# Patient Record
Sex: Female | Born: 1970 | Race: Black or African American | Hispanic: No | Marital: Married | Smoking: Never smoker
Health system: Southern US, Community
[De-identification: ages and names within clinical notes are randomized; demographics above are authoritative.]

## PROBLEM LIST (undated history)

## (undated) DIAGNOSIS — M5481 Occipital neuralgia: Secondary | ICD-10-CM

## (undated) HISTORY — DX: Occipital neuralgia: M54.81

## (undated) HISTORY — PX: WISDOM TOOTH EXTRACTION: SHX21

---

## 1997-06-08 ENCOUNTER — Inpatient Hospital Stay (HOSPITAL_COMMUNITY): Admission: AD | Admit: 1997-06-08 | Discharge: 1997-06-08 | Payer: Self-pay | Admitting: Obstetrics

## 1997-06-10 ENCOUNTER — Inpatient Hospital Stay (HOSPITAL_COMMUNITY): Admission: RE | Admit: 1997-06-10 | Discharge: 1997-06-10 | Payer: Self-pay | Admitting: Obstetrics

## 1998-07-01 ENCOUNTER — Other Ambulatory Visit: Admission: RE | Admit: 1998-07-01 | Discharge: 1998-07-01 | Payer: Self-pay | Admitting: Obstetrics and Gynecology

## 1998-11-22 ENCOUNTER — Other Ambulatory Visit: Admission: RE | Admit: 1998-11-22 | Discharge: 1998-11-22 | Payer: Self-pay | Admitting: Obstetrics and Gynecology

## 1999-04-22 ENCOUNTER — Encounter: Admission: RE | Admit: 1999-04-22 | Discharge: 1999-07-21 | Payer: Self-pay | Admitting: Obstetrics & Gynecology

## 1999-05-23 ENCOUNTER — Inpatient Hospital Stay (HOSPITAL_COMMUNITY): Admission: AD | Admit: 1999-05-23 | Discharge: 1999-05-26 | Payer: Self-pay | Admitting: Obstetrics and Gynecology

## 2001-12-04 ENCOUNTER — Other Ambulatory Visit: Admission: RE | Admit: 2001-12-04 | Discharge: 2001-12-04 | Payer: Self-pay | Admitting: Obstetrics and Gynecology

## 2002-01-31 ENCOUNTER — Ambulatory Visit (HOSPITAL_COMMUNITY): Admission: RE | Admit: 2002-01-31 | Discharge: 2002-01-31 | Payer: Self-pay | Admitting: Obstetrics and Gynecology

## 2002-01-31 ENCOUNTER — Encounter: Payer: Self-pay | Admitting: Obstetrics and Gynecology

## 2002-05-29 ENCOUNTER — Inpatient Hospital Stay (HOSPITAL_COMMUNITY): Admission: AD | Admit: 2002-05-29 | Discharge: 2002-05-29 | Payer: Self-pay | Admitting: Obstetrics and Gynecology

## 2002-06-09 ENCOUNTER — Inpatient Hospital Stay (HOSPITAL_COMMUNITY): Admission: AD | Admit: 2002-06-09 | Discharge: 2002-06-09 | Payer: Self-pay | Admitting: Obstetrics and Gynecology

## 2002-06-21 ENCOUNTER — Inpatient Hospital Stay (HOSPITAL_COMMUNITY): Admission: AD | Admit: 2002-06-21 | Discharge: 2002-06-21 | Payer: Self-pay | Admitting: Obstetrics and Gynecology

## 2002-06-25 ENCOUNTER — Inpatient Hospital Stay (HOSPITAL_COMMUNITY): Admission: AD | Admit: 2002-06-25 | Discharge: 2002-06-25 | Payer: Self-pay | Admitting: Obstetrics and Gynecology

## 2002-06-25 ENCOUNTER — Encounter: Payer: Self-pay | Admitting: Obstetrics and Gynecology

## 2002-06-30 ENCOUNTER — Inpatient Hospital Stay (HOSPITAL_COMMUNITY): Admission: AD | Admit: 2002-06-30 | Discharge: 2002-07-03 | Payer: Self-pay | Admitting: Obstetrics and Gynecology

## 2003-07-08 ENCOUNTER — Other Ambulatory Visit: Admission: RE | Admit: 2003-07-08 | Discharge: 2003-07-08 | Payer: Self-pay | Admitting: Family Medicine

## 2004-10-12 ENCOUNTER — Other Ambulatory Visit: Admission: RE | Admit: 2004-10-12 | Discharge: 2004-10-12 | Payer: Self-pay | Admitting: Obstetrics and Gynecology

## 2004-10-30 ENCOUNTER — Inpatient Hospital Stay (HOSPITAL_COMMUNITY): Admission: AD | Admit: 2004-10-30 | Discharge: 2004-10-30 | Payer: Self-pay | Admitting: Obstetrics and Gynecology

## 2005-04-05 ENCOUNTER — Inpatient Hospital Stay (HOSPITAL_COMMUNITY): Admission: AD | Admit: 2005-04-05 | Discharge: 2005-04-05 | Payer: Self-pay | Admitting: Obstetrics and Gynecology

## 2005-04-10 ENCOUNTER — Inpatient Hospital Stay (HOSPITAL_COMMUNITY): Admission: AD | Admit: 2005-04-10 | Discharge: 2005-04-13 | Payer: Self-pay | Admitting: Obstetrics and Gynecology

## 2005-04-14 ENCOUNTER — Encounter: Admission: RE | Admit: 2005-04-14 | Discharge: 2005-04-29 | Payer: Self-pay | Admitting: Obstetrics and Gynecology

## 2006-02-24 ENCOUNTER — Emergency Department (HOSPITAL_COMMUNITY): Admission: EM | Admit: 2006-02-24 | Discharge: 2006-02-24 | Payer: Self-pay | Admitting: *Deleted

## 2006-11-26 ENCOUNTER — Encounter: Admission: RE | Admit: 2006-11-26 | Discharge: 2006-11-26 | Payer: Self-pay | Admitting: Obstetrics and Gynecology

## 2008-12-22 ENCOUNTER — Other Ambulatory Visit: Admission: RE | Admit: 2008-12-22 | Discharge: 2008-12-22 | Payer: Self-pay | Admitting: Family Medicine

## 2010-06-17 NOTE — Discharge Summary (Signed)
[facility]  Patient:    Danielle Bauer, Danielle Bauer                        MRN: [id number] Adm. Date:  [id number] Disc. Date: [id number] Attending:  Leonard Schwartz Dictator:   Wynelle Bourgeois, C.N.M.                           Discharge Summary  ADMISSION DIAGNOSES:          1. Intrauterine pregnancy at 38 weeks.                               2. Preeclampsia.                               3. Favorable cervix.  DISCHARGE DIAGNOSES:          1. Intrauterine pregnancy at 38 weeks.                               2. Preeclampsia.                               3. Favorable cervix.                               4. Status post spontaneous vaginal delivery, term                                  delivered.                               5. Third degree perineal laceration.                               6. Arrested second stage of labor.  PROCEDURE:                    1) Magnesium sulfate administration.  2) Vacuum extraction vaginal delivery.  3) Repair of third degree midline laceration. 4) Epidural anesthesia.  HOSPITAL COURSE:              Danielle Bauer is a 40 year old, gravida 3, para 0-0-2-0, at 79 weeks who presented for admission secondary to preeclampsia.  She had been followed for increased blood pressure and developed 2+ proteinuria on cath UA and was scheduled for induction of labor.  Pregnancy had been remarkable for preeclampsia, history of one TAB and one SAB.  Family history of congenital anomaly.  The patient was admitted and begun on a magnesium sulfate infusion per protocol.  PIH labs were drawn and the physicians followed her course while in he hospital.  Later that evening on May 23, 1999, her contractions began to get stronger on Pitocin administration and her cervix was found to be 3 cm dilated ith -3 station.  Amniotomy was performed for clear fluid.  The fetal heart rate tracing was reactive with no decellerations noted and Pitocin  infusion was continued. t 10:35 p.m. her cervix  was 4 cm dilated, 80% effaced, and -1 station with a vertex presentation.  Intrauterine pressure catheter and fetal scalp electrode were placed.  The patient requested an epidural and received that for pain relief. t approximately 4 oclock a.m. on May 24, 1999, the patients cervix was completely dilated with a +1 station and pushing was begun for the second stage.  At approximately 6:30 a.m. the assessment of arrested second stage of labor was noted by Janine Limbo, M.D. and a vacuum extractor was applied.  The findings included a nuchal cord and a third degree laceration.  The viable female infant named Danielle Bauer was delivered, Apgars 8 and 9.  Weight not recorded.  The infant was taken to the nursery.  The patient was taken to the AICU for magnesium sulfate administration.  Her course postpartum was essentially normal with one episode f hypotension noted with a blood pressure of 60/40 in the sitting position which ade the patient dizzy, so magnesium sulfate administration was ceased for six hours to allow the patient to compensate with her blood pressure.  Her lochia was moderate, her hemoglobin was 9.8.  IV fluids were administered and the patient felt better soon thereafter.  On May 25, 1999, which was her postpartum day #1, her blood  pressures were ranging 120 to 160 over 65 to 88.  Oxygen saturations were 99% on room air.  She was afebrile.  Intake was 1480 in and 1350 out per shift and lungs were clear.  Heart was regular and abdomen was soft with a firm fundus and small lochia.  Extremities revealed 2+ edema with reflexes at 2+.  SGOT and SGPT were  normal with a magnesium level of 4.5 and the patient continued to be monitored n the AICU.  On May 26, 1999, which was postpartum day #2, the patient was seen by Maris Berger. Haygood, M.D. with blood pressures ranging 130 to 154 over 82 to 92 after magnesium sulfate  was discontinued.  Her physical examination was within normal limits and she did not exhibit any further signs of headache, blurred vision, or epigastric pain.  She was deemed to be ready for discharge and she was discharged home at that time by Maris Berger. Pennie Rushing, M.D.  DISCHARGE INSTRUCTIONS:       Per [facility].  DISCHARGE MEDICATIONS:        1. Motrin 600 mg p.o. q.6h. p.r.n. pain.                               2. Tylox one to two p.o. q.4h. p.r.n. pain.                               3. Prenatal vitamins one p.o. q.d.  DISCHARGE LABORATORY DATA:    Essentially within normal limits with creatinine f 0.7, potassium 4.1, liver function tests within normal limits, LDH 273, magnesium level 4.5 prior to discontinuation, uric acid 4.1, white blood cell count 18.3,  hemoglobin 8.0, hematocrit 24.0, platelets 178, and RPR nonreactive.  FOLLOW-UP:                    Will occur on May 1, at 2 p.m. in the office for  blood pressure check and then further follow-up per Janine Limbo, M.D. DD:  05/26/99 TD:  05/27/99 Job: [id number]

## 2010-06-17 NOTE — H&P (Signed)
NAMEHAILY, Danielle Bauer                           ACCOUNT NO.:  [id number]   MEDICAL RECORD NO.:  [id number]                   PATIENT TYPE:  INP   LOCATION:  9163                                 FACILITY:  [facility]   PHYSICIAN:  Naima A. Dillard, M.D.              DATE OF BIRTH:  1970-02-13   DATE OF ADMISSION:  06/30/2002  DATE OF DISCHARGE:                                HISTORY & PHYSICAL   HISTORY OF PRESENT ILLNESS:  This is a 40 year old gravida 4, para 1-0-2-1  at [redacted] weeks gestation who presents for induction of labor for chronic  hypertension.  Pregnancy has been followed by Naima A. Dillard, M.D. and  remarkable for AB x2, history of preeclampsia, family history of  anencephaly, obesity, chronic hypertension.   PAST OBSTETRICAL HISTORY:  Remarkable for an elective abortion in 1989, a  spontaneous abortion in 1999, a vaginal delivery (vacuum) in 2001 of a female  infant at [redacted] weeks gestation weighing 8 pounds 10 ounces under epidural  anesthesia remarkable for preeclampsia and a third degree laceration.   PAST MEDICAL HISTORY:  1. Remarkable for history of anemia in the past.  2. History of preeclampsia.  3. History of heart murmur.  4. History of chronic hypertension.   FAMILY HISTORY:  Remarkable for heart disease in her father, aunt, and  grandmother; hypertension in her mother, grandfather, and father;  varicosities in her mother; anemia in her sister; diabetes in her father and  grandfather; lung cancer in her grandfather.   GENETIC HISTORY:  Remarkable for mental retardation in her niece, sickle  cell trait in the father of the baby, and brother of the father of the baby  with anencephaly.   SOCIAL HISTORY:  The patient is married to [facility] who is involved and  supportive.  She is of the [place] faith.  She denies any alcohol,  tobacco, or drug use.   PRENATAL LABORATORIES:  Hemoglobin 11.4, platelets 232,000.  Blood type B+.  Antibody screen negative.  Sickle  cell negative.  RPR negative.  Rubella  immune.  HBSAG negative.  Pap test normal.  Gonorrhea negative.  Chlamydia  negative.  Cystic fibrosis declined.  Glucose challenge within normal  limits.  Group B Strep negative.   OBJECTIVE:  VITAL SIGNS:  Stable.  Blood pressure 145/99 and 132/84.  HEENT:  Within normal limits.  NECK:  Thyroid normal, not enlarged.  CHEST:  Clear to auscultation.  HEART:  Regular rate and rhythm.  ABDOMEN:  Gravid.  Vertex to Leopold's.  EFM shows reactive fetal heart rate  with occasional uterine contractions.  PELVIC:  Cervical examination on admission was closed and -2.  EXTREMITIES:  Trace edema.   LABORATORIES:  Admission CBC:  White blood cell count 12.3, hemoglobin 10.9,  platelets 268,000.  Urine was negative for protein.  RPR nonreactive.   ASSESSMENT:  1. Intrauterine pregnancy at 39 weeks.  2. Chronic hypertension.  3. History of preeclampsia.   PLAN:  1. Admit to birthing suite per Naima A. Dillard, M.D.  2. Routine M.D. orders.  3. Cytotec and then Pitocin for induction of labor and further orders to     follow.     Marie L. Williams, C.N.M.                 Naima A. Normand Sloop, M.D.    MLW/MEDQ  D:  07/01/2002  T:  07/01/2002  Job:  [id number]

## 2010-06-17 NOTE — H&P (Signed)
NAMERUDIE, RIKARD                 ACCOUNT NO.:  [id number]   MEDICAL RECORD NO.:  [id number]          PATIENT TYPE:  INP   LOCATION:  9168                          FACILITY:  [facility]   PHYSICIAN:  Crist Fat. Rivard, M.D. DATE OF BIRTH:  29-May-1970   DATE OF ADMISSION:  04/10/2005  DATE OF DISCHARGE:                                HISTORY & PHYSICAL   Ms. Palmisano is a 41 year old married black female, gravida 5, para 2-0-2-2,  who presents at 38 weeks for induction of labor due to chronic hypertension  and superimposed PIH.  Her pregnancy has been followed by the [facility] M.D. Service and has been remarkable for:  1.  Two ABs.  2.  History of preeclampsia.  3.  Chronic hypertension.  4.  A family history of anencephaly.   PRENATAL LABORATORY:  Her prenatal labs were collected on October 21, 2004.  Hemoglobin 12, hematocrit 36.6 and platelets 239,000. Blood type B  positive, antibody negative, sickle cell trait negative, RPR nonreactive,  rubella immune, hepatitis B surface antigen negative, 1-hour Glucola from  January 18, 2005 was 113 and hemoglobin at that time was 10.6.  Culture the  vaginal tract for group B strep in the third trimester was negative.   HISTORY OF PRESENT PREGNANCY:  The patient presented for care at [facility] on October 07, 2004 at 11 and 4/[redacted] weeks gestation.  She had  some bleeding in the 11-12 week of pregnancy.  She continued to have some  dark brown old blood for vaginal discharge through 14th week of pregnancy.  The patient had ultrasound with Dr. Sherrie George showed normal anatomy and no sub-  chorionic hemorrhage seen.  The placenta was within normal limits.  During  that first and second trimester, the patient remained normotensive without  blood pressure medication.  Ultrasonography at [redacted] weeks gestation showed  growth consistent with previous dating and a normal AFI.  Ultrasonography at  [redacted] weeks gestation showed estimated  fetal weight at the 46th percentile with  normal fluid and grade 2-3 placenta.  At that point, antenatal testing was  started with NSTs 2 times weekly.  Ultrasonography at 34 weeks showed normal  growth.  Blood pressures remained well controlled until most recently when  the decision was made to undergo induction due to superimposed PIH.   OBSTETRIC HISTORY:  She is a gravida 5, para 2-0-2-2.  In June of 1989, she  had a TAB.  In April of 1994, at [redacted] weeks gestation, she had an SAB in  [place].  In April of 2001, she had a vacuum extraction for a female infant  weighing 8 pounds and 10 ounces at [redacted] weeks gestation after 12 hours in  labor, she had an epidural for anesthesia, she was induced due to  preeclampsia and she had a third-degree laceration.  In June of 2004, she  had a vaginal delivery of a female infant weighing 8 pounds and 15 ounces at  [redacted] weeks gestation after 8 hours of labor, she had an epidural for  anesthesia and she  was induced secondary to chronic hypertension.  She  reports having anemia postpartum with her second child and she required  labetalol postpartum.   MEDICAL HISTORY:  She has no medication allergies.  She experienced menarche  at the age of 60 with 28-day cycles lasting 7 days.  She has used condoms,  Depo-Provera, oral contraceptives and the patch in the past for  contraception.  She has an occasional yeast infection.  She reports having  had the usual childhood illnesses.  The patient has chronic hypertension and  does not require medications currently.  The patient and sister with anemia.  The patient reports chronic constipation.   SURGICAL HISTORY:  Negative.   FAMILY MEDICAL HISTORY:  Remarkable for father, aunt and maternal  grandmother with heart disease, and those same family members with  hypertension.  Mother with varicosities.  Father with diet controlled  diabetes.  Maternal grandfather with insulin-dependent diabetes.  The  patient's husband   With type 2 diabetes.  Maternal grandfather with lung  cancer.  A first cousin is a cocaine user and several family members are  alcohol users.   GENETIC HISTORY:  Remarkable for the patient's niece with mental  retardation.  Father of the baby with sickle cell trait.  The father of the  baby's brother with questionable anencephaly.   SOCIAL HISTORY:  The patient is married to the father of the baby.  His name  is Animal nutritionist.  He is involved and supportive.  They are of a nondenominational  faith.  Both have some college education.  The patient is employed full-time  as a Veterinary surgeon.  The father of the baby is employed full-time in  transportation.  They deny any alcohol, tobacco or illicit drug use with the  pregnancy.   OBJECTIVE DATA:  VITAL SIGNS:  Blood pressure is 134/89.  Other vital signs  are stable.  She is afebrile.  HEENT:  Grossly within normal limits.  CHEST:  Clear to auscultation.  HEART:  Regular rate and rhythm.  ABDOMEN:  Gravid in contour with a fundal height extending approximately 38  cm above the pubic symphysis.  Fetal heart rate is reassuring.  Uterine  contractions are irregular.  CERVIX:  Unfavorable.   LABORATORY:  A urinalysis is negative for protein, remarkable only for a  small hemoglobin and trace leukocyte esterase.  Liver enzymes are within  normal limits.  LDH is 133.  Uric acid is 2.6.   ASSESSMENT:  1.  Intrauterine pregnancy at 38 weeks.  2.  Chronic hypertension with superimposed pregnancy induced hypertension.  3.  Unfavorable cervix.   PLAN:  1.  Admit to birthing suites.  2.  Routine M.D. orders.  3.  Plan Cervidil for ripening with a continuation of the planned to be      determined tomorrow by the M.D. on call.      Cam Hai, C.N.M.      Crist Fat Rivard, M.D.  Electronically Signed    KS/MEDQ  D:  04/11/2005  T:  04/11/2005  Job:  [id number]

## 2010-06-17 NOTE — Op Note (Signed)
[facility]  Patient:    Danielle Bauer, Danielle Bauer                        MRN: [id number] Proc. Date: 05/24/99 Adm. Date:  [id number] Attending:  Leonard Schwartz                           Operative Report  PREOPERATIVE DIAGNOSES:       1. Term intrauterine pregnancy.                               2. Preeclampsia.                               3. Arrested second stage of labor.  POSTOPERATIVE DIAGNOSES:      1. Term intrauterine pregnancy.                               2. Preeclampsia.                               3. Arrested second stage of labor.  PROCEDURE:                    Vacuum-assisted vaginal delivery with repair of a  third degree midline laceration.  OBSTETRICIAN:                 Janine Limbo, M.D.  ANESTHESIA:                   Epidural.  DISPOSITION:                  The patient is a 40 year old female who was admitted because of preeclampsia.  She was started on Pitocin and her membranes were ruptured.  The patient became completely dilated and she pushed for two hours. The fetal head did present at a +3 station but she was unable to push the baby any further.  Options for management were reviewed including resting, continued pushing, forceps delivery, vacuum-assisted vaginal delivery and cesarean delivery. The risks and benefits of each of those options were reviewed.  The specific options associated with vacuum-assisted vaginal delivery were reviewed including the risk of caput formation, hematoma formation, intracranial bleeding and the act that the vacuum extraction may be unsuccessful and we may need to proceed with cesarean section anyway.  The patient, her husband and her mother all agreed upon vacuum-assisted vaginal delivery.  FINDINGS:                     An 8-pound 10-ounce female infant [name]) was delivered.  The Apgars were 8 at one minute and 9 at five minutes.  The umbilical cord had three vessels.  The  placenta was normal.  There was a third degree midline laceration present.  There were no upper vaginal or cervical lacerations.  DESCRIPTION OF PROCEDURE:     The patient was placed in a lithotomy position. he perineum was prepped with multiple layers of Betadine and then sterilely draped. Attempts were made to drain the bladder of urine but these were unsuccessful because of the position of the fetal head.  The fetal head was  noted to be in an occipitoanterior presentation.  The cervix was completely dilated.  The head was at a +3 station.  The Kiwi vacuum extractor was applied and with several pushes, we were able to deliver the fetal head.  The mouth and nose were suctioned.  The remainder of the infant was delivered.  Routine cord blood studies were obtained. The placenta was removed.  The patient was noted to have no upper vaginal or cervical lacerations.  There was a third degree midline laceration.  Allis clamps were used to isolate the sphincter muscles.  The sphincter muscles were repaired using figure-of-eight sutures of 0 chromic catgut.  We then used 0 chromic and 2-0 chromic catgut to perform a standard repair.  The patient tolerated her procedure well.  She did have an episode of brisk bleeding and this was controlled using IV Pitocin and fundal massage.  The estimated blood loss was 450 cc. Needle counts were correct.  The patient was returned to the supine position.  The infant was allowed to remain in the room with the parents so that the family could bond. DD:  05/25/99 TD:  05/26/99 Job: [id number]

## 2010-12-27 ENCOUNTER — Other Ambulatory Visit: Payer: Self-pay | Admitting: Obstetrics and Gynecology

## 2010-12-27 DIAGNOSIS — N644 Mastodynia: Secondary | ICD-10-CM

## 2011-02-06 ENCOUNTER — Ambulatory Visit
Admission: RE | Admit: 2011-02-06 | Discharge: 2011-02-06 | Disposition: A | Discharge status: Home / Self Care | Payer: BC Managed Care – PPO | Source: Ambulatory Visit | Attending: Obstetrics and Gynecology | Admitting: Obstetrics and Gynecology

## 2011-02-06 DIAGNOSIS — N644 Mastodynia: Secondary | ICD-10-CM

## 2013-02-13 ENCOUNTER — Encounter (HOSPITAL_COMMUNITY): Payer: Self-pay | Admitting: Emergency Medicine

## 2013-02-13 DIAGNOSIS — J0130 Acute sphenoidal sinusitis, unspecified: Secondary | ICD-10-CM | POA: Insufficient documentation

## 2013-02-13 DIAGNOSIS — H9209 Otalgia, unspecified ear: Secondary | ICD-10-CM | POA: Insufficient documentation

## 2013-02-13 DIAGNOSIS — H6500 Acute serous otitis media, unspecified ear: Secondary | ICD-10-CM | POA: Insufficient documentation

## 2013-02-13 LAB — CBC
HCT: 35.6 % — ABNORMAL LOW (ref 36.0–46.0)
Hemoglobin: 11.9 g/dL — ABNORMAL LOW (ref 12.0–15.0)
MCH: 24 pg — ABNORMAL LOW (ref 26.0–34.0)
MCHC: 33.4 g/dL (ref 30.0–36.0)
MCV: 71.8 fL — ABNORMAL LOW (ref 78.0–100.0)
Platelets: 178 10*3/uL (ref 150–400)
RBC: 4.96 MIL/uL (ref 3.87–5.11)
RDW: 19.1 % — ABNORMAL HIGH (ref 11.5–15.5)
WBC: 5.1 10*3/uL (ref 4.0–10.5)

## 2013-02-13 NOTE — ED Notes (Signed)
Per ems-- pt reports hour and half ago R ear started ringing- [facility]. Pt became nauseated, vomited x 2. Pt denies pain, sob. Pt with hx of vertigo 10 years ago. 20 G L hand. ems administered 4mg  zofran. NSR. cbg 83, vvs.

## 2013-02-13 NOTE — ED Notes (Signed)
Pt c/o right side earache x 1 month and tonight nausea with dizziness, and weakness. No signs of neuro deficits. Grips equal bilaterally, no slur speech, and no arm drift.

## 2013-02-14 ENCOUNTER — Emergency Department (HOSPITAL_COMMUNITY)
Admission: EM | Admit: 2013-02-14 | Discharge: 2013-02-14 | Disposition: A | Discharge status: Home / Self Care | Payer: BC Managed Care – PPO | Attending: Emergency Medicine | Admitting: Emergency Medicine

## 2013-02-14 ENCOUNTER — Emergency Department (HOSPITAL_COMMUNITY): Payer: BC Managed Care – PPO

## 2013-02-14 DIAGNOSIS — H65191 Other acute nonsuppurative otitis media, right ear: Secondary | ICD-10-CM

## 2013-02-14 DIAGNOSIS — R42 Dizziness and giddiness: Secondary | ICD-10-CM

## 2013-02-14 DIAGNOSIS — J329 Chronic sinusitis, unspecified: Secondary | ICD-10-CM

## 2013-02-14 MED ORDER — OXYMETAZOLINE HCL 0.05 % NA SOLN: 1.0000 | Freq: Two times a day (BID) | NASAL | Status: DC

## 2013-02-14 MED ORDER — MECLIZINE HCL 25 MG PO TABS: 25.0000 mg | ORAL_TABLET | Freq: Three times a day (TID) | ORAL | Status: DC | PRN

## 2013-02-14 MED ORDER — AMOXICILLIN-POT CLAVULANATE 875-125 MG PO TABS
1.0000 | ORAL_TABLET | Freq: Once | ORAL | Status: AC
  Administered 2013-02-14: 1 via ORAL
  Filled 2013-02-14: qty 1

## 2013-02-14 MED ORDER — PSEUDOEPHEDRINE HCL ER 120 MG PO TB12: 120.0000 mg | ORAL_TABLET | Freq: Two times a day (BID) | ORAL | Status: DC

## 2013-02-14 MED ORDER — AMOXICILLIN-POT CLAVULANATE 875-125 MG PO TABS
1.0000 | ORAL_TABLET | Freq: Two times a day (BID) | ORAL | Status: DC
Start: 2013-02-14 — End: 2014-08-27

## 2013-02-14 NOTE — Discharge Instructions (Signed)
Please take medications as prescribed.  Follow up with Dr Annalee Genta. Return to the ER for worsening condition or new concerning symptoms.   Sinusitis Sinusitis is redness, soreness, and swelling (inflammation) of the paranasal sinuses. Paranasal sinuses are air pockets within the bones of your face (beneath the eyes, the middle of the forehead, or above the eyes). In healthy paranasal sinuses, mucus is able to drain out, and air is able to circulate through them by way of your nose. However, when your paranasal sinuses are inflamed, mucus and air can become trapped. This can allow bacteria and other germs to grow and cause infection. Sinusitis can develop quickly and last only a short time (acute) or continue over a long period (chronic). Sinusitis that lasts for more than 12 weeks is considered chronic.  CAUSES  Causes of sinusitis include:  Allergies.  Structural abnormalities, such as displacement of the cartilage that separates your nostrils (deviated septum), which can decrease the air flow through your nose and sinuses and affect sinus drainage.  Functional abnormalities, such as when the small hairs (cilia) that line your sinuses and help remove mucus do not work properly or are not present. SYMPTOMS  Symptoms of acute and chronic sinusitis are the same. The primary symptoms are pain and pressure around the affected sinuses. Other symptoms include:  Upper toothache.  Earache.  Headache.  Bad breath.  Decreased sense of smell and taste.  A cough, which worsens when you are lying flat.  Fatigue.  Fever.  Thick drainage from your nose, which often is green and may contain pus (purulent).  Swelling and warmth over the affected sinuses. DIAGNOSIS  Your caregiver will perform a physical exam. During the exam, your caregiver may:  Look in your nose for signs of abnormal growths in your nostrils (nasal polyps).  Tap over the affected sinus to check for signs of  infection.  View the inside of your sinuses (endoscopy) with a special imaging device with a light attached (endoscope), which is inserted into your sinuses. If your caregiver suspects that you have chronic sinusitis, one or more of the following tests may be recommended:  Allergy tests.  Nasal culture A sample of mucus is taken from your nose and sent to a lab and screened for bacteria.  Nasal cytology A sample of mucus is taken from your nose and examined by your caregiver to determine if your sinusitis is related to an allergy. TREATMENT  Most cases of acute sinusitis are related to a viral infection and will resolve on their own within 10 days. Sometimes medicines are prescribed to help relieve symptoms (pain medicine, decongestants, nasal steroid sprays, or saline sprays).  However, for sinusitis related to a bacterial infection, your caregiver will prescribe antibiotic medicines. These are medicines that will help kill the bacteria causing the infection.  Rarely, sinusitis is caused by a fungal infection. In theses cases, your caregiver will prescribe antifungal medicine. For some cases of chronic sinusitis, surgery is needed. Generally, these are cases in which sinusitis recurs more than 3 times per year, despite other treatments. HOME CARE INSTRUCTIONS   Drink plenty of water. Water helps thin the mucus so your sinuses can drain more easily.  Use a humidifier.  Inhale steam 3 to 4 times a day (for example, sit in the bathroom with the shower running).  Apply a warm, moist washcloth to your face 3 to 4 times a day, or as directed by your caregiver.  Use saline nasal sprays to help moisten  and clean your sinuses.  Take over-the-counter or prescription medicines for pain, discomfort, or fever only as directed by your caregiver. SEEK IMMEDIATE MEDICAL CARE IF:  You have increasing pain or severe headaches.  You have nausea, vomiting, or drowsiness.  You have swelling around your  face.  You have vision problems.  You have a stiff neck.  You have difficulty breathing. MAKE SURE YOU:   Understand these instructions.  Will watch your condition.  Will get help right away if you are not doing well or get worse. Document Released: 01/16/2005 Document Revised: 04/10/2011 Document Reviewed: 01/31/2011 [facility] Patient Information 2014 [place], [place].  Serous Otitis Media  Serous otitis media is fluid in the middle ear space. This space contains the bones for hearing and air. Air in the middle ear space helps to transmit sound.  The air gets there through the eustachian tube. This tube goes from the back of the nose (nasopharynx) to the middle ear space. It keeps the pressure in the middle ear the same as the outside world. It also helps to drain fluid from the middle ear space. CAUSES  Serous otitis media occurs when the eustachian tube gets blocked. Blockage can come from:  Ear infections.  Colds and other upper respiratory infections.  Allergies.  Irritants such as cigarette smoke.  Sudden changes in air pressure (such as descending in an airplane).  Enlarged adenoids.  A mass in the nasopharynx. During colds and upper respiratory infections, the middle ear space can become temporarily filled with fluid. This can happen after an ear infection also. Once the infection clears, the fluid will generally drain out of the ear through the eustachian tube. If it does not, then serous otitis media occurs. SIGNS AND SYMPTOMS   Hearing loss.  A feeling of fullness in the ear, without pain.  Young children may not show any symptoms but may show slight behavioral changes, such as agitation, ear pulling, or crying. DIAGNOSIS  Serous otitis media is diagnosed by an ear exam. Tests may be done to check on the movement of the eardrum. Hearing exams may also be done. TREATMENT  The fluid most often goes away without treatment. If allergy is the cause, allergy  treatment may be helpful. Fluid that persists for several months may require minor surgery. A small tube is placed in the eardrum to:  Drain the fluid.  Restore the air in the middle ear space. In certain situations, antibiotics are used to avoid surgery. Surgery may be done to remove enlarged adenoids (if this is the cause). HOME CARE INSTRUCTIONS   Keep children away from tobacco smoke.  Be sure to keep any follow-up appointments. SEEK MEDICAL CARE IF:   Your hearing is not better in 3 months.  Your hearing is worse.  You have ear pain.  You have drainage from the ear.  You have dizziness.  You have serous otitis media only in one ear or have any bleeding from your nose (epistaxis).  You notice a lump on your neck. MAKE SURE YOU:  Understand these instructions.   Will watch your condition.   Will get help right away if you are not doing well or get worse.  Document Released: 04/08/2003 Document Revised: 09/18/2012 Document Reviewed: 08/13/2012 [facility] Patient Information 2014 [place], [place].  Vertigo Vertigo means you feel like you or your surroundings are moving when they are not. Vertigo can be dangerous if it occurs when you are at work, driving, or performing difficult activities.  CAUSES  Vertigo occurs when there is a conflict of signals sent to your brain from the visual and sensory systems in your body. There are many different causes of vertigo, including:  Infections, especially in the inner ear.  A bad reaction to a drug or misuse of alcohol and medicines.  Withdrawal from drugs or alcohol.  Rapidly changing positions, such as lying down or rolling over in bed.  A migraine headache.  Decreased blood flow to the brain.  Increased pressure in the brain from a head injury, infection, tumor, or bleeding. SYMPTOMS  You may feel as though the world is spinning around or you are falling to the ground. Because your balance is upset, vertigo can cause  nausea and vomiting. You may have involuntary eye movements (nystagmus). DIAGNOSIS  Vertigo is usually diagnosed by physical exam. If the cause of your vertigo is unknown, your caregiver may perform imaging tests, such as an MRI scan (magnetic resonance imaging). TREATMENT  Most cases of vertigo resolve on their own, without treatment. Depending on the cause, your caregiver may prescribe certain medicines. If your vertigo is related to body position issues, your caregiver may recommend movements or procedures to correct the problem. In rare cases, if your vertigo is caused by certain inner ear problems, you may need surgery. HOME CARE INSTRUCTIONS   Follow your caregiver's instructions.  Avoid driving.  Avoid operating heavy machinery.  Avoid performing any tasks that would be dangerous to you or others during a vertigo episode.  Tell your caregiver if you notice that certain medicines seem to be causing your vertigo. Some of the medicines used to treat vertigo episodes can actually make them worse in some people. SEEK IMMEDIATE MEDICAL CARE IF:   Your medicines do not relieve your vertigo or are making it worse.  You develop problems with talking, walking, weakness, or using your arms, hands, or legs.  You develop severe headaches.  Your nausea or vomiting continues or gets worse.  You develop visual changes.  A family member notices behavioral changes.  Your condition gets worse. MAKE SURE YOU:  Understand these instructions.  Will watch your condition.  Will get help right away if you are not doing well or get worse. Document Released: 10/26/2004 Document Revised: 04/10/2011 Document Reviewed: 08/04/2010 [facility] Patient Information 2014 [place], [place].

## 2013-02-14 NOTE — ED Provider Notes (Signed)
CSN: [id number]     Arrival date & time 02/13/13  1909 History   First MD Initiated Contact with Patient 02/14/13 0142     Chief Complaint  Patient presents with  . Dizziness   (Consider location/radiation/quality/duration/timing/severity/associated sxs/prior Treatment) HPI 43 year old female presents emergency apartment with complaint of acute onset of vertigo associated with nausea and vomiting.  She has had fullness and pain to her right ear ongoing for last month.  Tonight, as she was getting her kids ready for karate practice she had ringing in her right ear, and then acute vertigo.  She reports past history of vertigo with similar symptoms.  She reports room spinning.  No other focal neuro deficits.  She reports after receiving Zofran from EMS, symptoms have abated.  She is no longer dizzy or vertiginous.  No headache.  No fevers no chills.  She does have persistent pain and congestion in her right ear. History reviewed. No pertinent past medical history. History reviewed. No pertinent past surgical history. History reviewed. No pertinent family history. History  Substance Use Topics  . Smoking status: Never Smoker   . Smokeless tobacco: Never Used  . Alcohol Use: No   OB History   Grav Para Term Preterm Abortions TAB SAB Ect Mult Living                 Review of Systems  See History of Present Illness; otherwise all other systems are reviewed and negative Allergies  Review of patient's allergies indicates no known allergies.  Home Medications   Current Outpatient Rx  Name  Route  Sig  Dispense  Refill  . Green Tea, Camillia sinensis, (GREEN TEA PO)   Oral   Take 1 tablet by mouth 2 (two) times daily.         Marland Kitchen. OVER THE COUNTER MEDICATION   Oral   Take 1 Package by mouth daily. Green vibrance         . OVER THE COUNTER MEDICATION   Oral   Take 1 tablet by mouth daily. Mega mind         . amoxicillin-clavulanate (AUGMENTIN) 875-125 MG per tablet   Oral   Take  1 tablet by mouth every 12 (twelve) hours.   14 tablet   0   . meclizine (ANTIVERT) 25 MG tablet   Oral   Take 1 tablet (25 mg total) by mouth 3 (three) times daily as needed for dizziness.   30 tablet   0   . oxymetazoline (AFRIN NASAL SPRAY) 0.05 % nasal spray   Each Nare   Place 1 spray into both nostrils 2 (two) times daily. For no more than 3 days   30 mL   0   . pseudoephedrine (SUDAFED 12 HOUR) 120 MG 12 hr tablet   Oral   Take 1 tablet (120 mg total) by mouth 2 (two) times daily.   60 tablet   0    BP 119/77  Pulse 59  Temp(Src) 97.7 F (36.5 C) (Oral)  Resp 16  SpO2 99%  LMP 01/13/2013 Physical Exam  Nursing note and vitals reviewed. Constitutional: She is oriented to person, place, and time. She appears well-developed and well-nourished.  HENT:  Head: Normocephalic and atraumatic.  Nose: Nose normal.  Mouth/Throat: Oropharynx is clear and moist.  Patient with serous effusion in right ear, slightly more fluid behind her left TM.  Neither are bulging or erythematous.  Eyes: Conjunctivae and EOM are normal. Pupils are equal, round, and  reactive to light.  Neck: Normal range of motion. Neck supple. No JVD present. No tracheal deviation present. No thyromegaly present.  Cardiovascular: Normal rate, regular rhythm, normal heart sounds and intact distal pulses.  Exam reveals no gallop and no friction rub.   No murmur heard. Pulmonary/Chest: Effort normal and breath sounds normal. No stridor. No respiratory distress. She has no wheezes. She has no rales. She exhibits no tenderness.  Abdominal: Soft. Bowel sounds are normal. She exhibits no distension and no mass. There is no tenderness. There is no rebound and no guarding.  Musculoskeletal: Normal range of motion. She exhibits no edema and no tenderness.  Lymphadenopathy:    She has no cervical adenopathy.  Neurological: She is alert and oriented to person, place, and time. She has normal reflexes. No cranial nerve  deficit. She exhibits normal muscle tone. Coordination normal.  Skin: Skin is warm and dry. No rash noted. No erythema. No pallor.  Psychiatric: She has a normal mood and affect. Her behavior is normal. Judgment and thought content normal.    ED Course  Procedures (including critical care time) Labs Review Labs Reviewed  CBC - Abnormal; Notable for the following:    Hemoglobin 11.9 (*)    HCT 35.6 (*)    MCV 71.8 (*)    MCH 24.0 (*)    RDW 19.1 (*)    All other components within normal limits   Imaging Review Ct Head Wo Contrast  02/14/2013   CLINICAL DATA:  Right ear pain for 1 month.  Effusion on exam.  EXAM: CT HEAD WITHOUT CONTRAST  TECHNIQUE: Contiguous axial images were obtained from the base of the skull through the vertex without intravenous contrast.  COMPARISON:  None.  FINDINGS: Skull and Sinuses:There is essentially complete opacification of the right sphenoid sinus, with mucosal thickening and central high-density material. Mild patchy mucosal thickening in the bilateral ethmoid sinuses. No mastoid or middle ear effusion to explain the right ear symptoms. No visible thickening of the external canal to suggest an otitis externa. The internal auditory canals are symmetric.  Orbits: No acute abnormality.  Brain: No evidence of acute abnormality, such as acute infarction, hemorrhage, hydrocephalus, or mass lesion/mass effect. 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  IMPRESSION: 1. No evidence of acute intracranial disease. 2. Right sphenoid sinusitis. 3. No mastoid or middle ear effusion.   Electronically Signed   By: Tiburcio Pea M.D.   On: 02/14/2013 03:06    EKG Interpretation   None       MDM   1. Sinusitis   2. Acute effusion of right ear   3. Vertigo    43 year old female with acute vertigo, associated with a month history of right ear fullness and pain.  She has a serous effusion behind the right ear, and also behind the left.  Her  vertigo has resolved completely.  Her CBC is normal.  An i-STAT was ordered, but not run, and canceled.  Subsequently.  CT scan shows opacification of the right sphenoid sinus with acute muscle thickening.  No mastoid effusion.  Plan to start patient on Sudafed and Afrin to help with drainage of the eustachian tube.  We'll refer her to ENT for further workup.  We'll prescribe meclizine for any further vertigo.  I do not feel that symptoms are due to to cerebellar stroke as they were transitory in nature, and have completely resolved at this time.    Olivia Mackie, MD 02/14/13 [phone]

## 2013-02-27 ENCOUNTER — Other Ambulatory Visit (HOSPITAL_COMMUNITY)
Admission: RE | Admit: 2013-02-27 | Discharge: 2013-02-27 | Disposition: A | Discharge status: Home / Self Care | Payer: BC Managed Care – PPO | Source: Ambulatory Visit | Attending: Family Medicine | Admitting: Family Medicine

## 2013-02-27 ENCOUNTER — Other Ambulatory Visit: Payer: Self-pay | Admitting: Family Medicine

## 2013-02-27 DIAGNOSIS — Z0000 Encounter for general adult medical examination without abnormal findings: Secondary | ICD-10-CM | POA: Insufficient documentation

## 2013-08-23 ENCOUNTER — Encounter: Payer: Self-pay | Admitting: Family Medicine

## 2013-08-23 ENCOUNTER — Ambulatory Visit (INDEPENDENT_AMBULATORY_CARE_PROVIDER_SITE_OTHER): Payer: BC Managed Care – PPO | Admitting: Family Medicine

## 2013-08-23 VITALS — BP 138/80 | HR 72 | Temp 97.9°F | Resp 16

## 2013-08-23 DIAGNOSIS — Z23 Encounter for immunization: Secondary | ICD-10-CM

## 2013-08-23 DIAGNOSIS — IMO0002 Reserved for concepts with insufficient information to code with codable children: Secondary | ICD-10-CM

## 2013-08-23 DIAGNOSIS — T148XXA Other injury of unspecified body region, initial encounter: Secondary | ICD-10-CM

## 2013-08-23 NOTE — Progress Notes (Signed)
Verbal consent obtained from patient.  Anesthesia with metacarpal block with 3cc Lidocaine 2% without epinephrine.  Wound scrubbed with soap and water and rinsed.  Wound closed with #8 4-0 Prolene simple interrupted sutures.  Wound cleansed and dressed.

## 2013-08-23 NOTE — Progress Notes (Signed)
   Subjective:    Patient ID: Lockie MolaSonja T Trzcinski, female    DOB: September 15, 1970, 43 y.o.   MRN: [id number] This chart was scribed for Caleb PoppKurt Lauensein, MD by Leona CarryG. Clay Sherrill, ED Scribe. The patient was seen in Room 6. Patient's care was started at 11:50 AM.  HPI HPI Comments: Lockie MolaSonja T Dorey is a 43 y.o. female who presents to the Emergency Department complaining of a laceration on her right index finger. Patient reports that she cut her finger while trimming flowers in her kitchen approximately twenty minutes ago. She states that she still has sensation in her fingertip. Patient reports that bleeding stopped after she applied pressure for several minutes. She is unsure if her tetanus is up to date.  PCP is Dr. Cliffton AstersWhite.    Review of Systems  Skin: Positive for wound (Right index finger laceration).       Objective:   Physical Exam  Linear laceration with full-thickness on the radial side of left index finger extending from the PIP joint past the DIP joint.  There is no sensory compromise, the wound is not actively bleeding, and range of motion is full.  Please see PA note on repair of 3.5 cm laceration    Assessment & Plan:   This chart was scribed in my presence and reviewed by me personally.  Wound instructions given Followup one week, sooner if signs of infection  Signed, Sheila OatsKurt Chistopher Mangino M.D.

## 2013-08-23 NOTE — Patient Instructions (Signed)

## 2013-09-02 ENCOUNTER — Telehealth: Payer: Self-pay

## 2013-09-02 NOTE — Telephone Encounter (Signed)
PATIENT STATES SHE WAS IN THE OFFICE 2 WEEKS AGO WITH A CUT FINGER. SHE DID NOT GET HER AFTER VISIT SUMMARY. SHE WOULD LIKE TO BE CALLED WHEN IT CAN BE PICKED UP. BEST PHONE [phone] (CLLE) MBC

## 2013-09-03 NOTE — Telephone Encounter (Signed)
Printed avs- lm advised pt it was ready for pick up.

## 2014-08-27 ENCOUNTER — Ambulatory Visit (INDEPENDENT_AMBULATORY_CARE_PROVIDER_SITE_OTHER): Payer: BC Managed Care – PPO | Admitting: Emergency Medicine

## 2014-08-27 VITALS — BP 140/90 | HR 75 | Temp 97.8°F | Resp 16 | Ht 68.0 in | Wt 239.0 lb

## 2014-08-27 DIAGNOSIS — M5412 Radiculopathy, cervical region: Secondary | ICD-10-CM | POA: Diagnosis not present

## 2014-08-27 MED ORDER — CYCLOBENZAPRINE HCL 10 MG PO TABS: 10.0000 mg | ORAL_TABLET | Freq: Three times a day (TID) | ORAL | Status: DC | PRN

## 2014-08-27 MED ORDER — NAPROXEN SODIUM 550 MG PO TABS: 550.0000 mg | ORAL_TABLET | Freq: Two times a day (BID) | ORAL | Status: AC

## 2014-08-27 MED ORDER — HYDROCODONE-ACETAMINOPHEN 5-325 MG PO TABS: 1.0000 | ORAL_TABLET | ORAL | Status: DC | PRN

## 2014-08-27 NOTE — Patient Instructions (Signed)

## 2014-08-27 NOTE — Progress Notes (Signed)
Subjective:  Patient ID: Danielle Bauer, female    DOB: Mar 23, 1970  Age: 44 y.o. MRN: [id number]  CC: Arm Pain   HPI [name] presents   Pain in her left arm. She woke this morning and had pain in her left arm she had a radiates down the anterior other flexor surface of her left arm to the mid forearm and occasionally and/or median distribution of her hand. She denies any weakness numbness or tingling. She has no history of injury or overuse. She has no neck pain. Said no improvement with over-the-counter medication Tylenol , Motrin, or her prescribed pain medicine that she is has anticipation of having her wisdom teeth pulled  History [name] has no past medical history on file.   She has past surgical history that includes Wisdom tooth extraction.   Her  family history is not on file.  She   reports that she has never smoked. She has never used smokeless tobacco. She reports that she does not drink alcohol or use illicit drugs.  Outpatient Prescriptions Prior to Visit  Medication Sig Dispense Refill  . Green Tea, Camillia sinensis, (GREEN TEA PO) Take 1 tablet by mouth 2 (two) times daily.    Marland Kitchen OVER THE COUNTER MEDICATION Take 1 Package by mouth daily. Green vibrance    . meclizine (ANTIVERT) 25 MG tablet Take 1 tablet (25 mg total) by mouth 3 (three) times daily as needed for dizziness. (Patient not taking: Reported on 08/27/2014) 30 tablet 0  . Multiple Vitamin (MULTIVITAMIN) capsule Take 1 capsule by mouth daily.    Marland Kitchen OVER THE COUNTER MEDICATION Take 1 tablet by mouth daily. Mega mind    . oxymetazoline (AFRIN NASAL SPRAY) 0.05 % nasal spray Place 1 spray into both nostrils 2 (two) times daily. For no more than 3 days (Patient not taking: Reported on 08/27/2014) 30 mL 0  . amoxicillin-clavulanate (AUGMENTIN) 875-125 MG per tablet Take 1 tablet by mouth every 12 (twelve) hours. 14 tablet 0  . pseudoephedrine (SUDAFED 12 HOUR) 120 MG 12 hr tablet Take 1 tablet (120 mg total) by  mouth 2 (two) times daily. 60 tablet 0   No facility-administered medications prior to visit.    History   Social History  . Marital Status: Married    Spouse Name: N/A  . Number of Children: N/A  . Years of Education: N/A   Social History Main Topics  . Smoking status: Never Smoker   . Smokeless tobacco: Never Used  . Alcohol Use: No  . Drug Use: No  . Sexual Activity: Yes   Other Topics Concern  . None   Social History Narrative     Review of Systems  Constitutional: Negative for fever, chills and appetite change.  HENT: Negative for congestion, ear pain, postnasal drip, sinus pressure and sore throat.   Eyes: Negative for pain and redness.  Respiratory: Negative for cough, shortness of breath and wheezing.   Cardiovascular: Negative for leg swelling.  Gastrointestinal: Negative for nausea, vomiting, abdominal pain, diarrhea, constipation and blood in stool.  Endocrine: Negative for polyuria.  Genitourinary: Negative for dysuria, urgency, frequency and flank pain.  Musculoskeletal: Negative for gait problem.  Skin: Negative for rash.  Neurological: Negative for weakness and headaches.  Psychiatric/Behavioral: Negative for confusion and decreased concentration. The patient is not nervous/anxious.     Objective:  BP 140/90 mmHg  Pulse 75  Temp(Src) 97.8 F (36.6 C) (Oral)  Resp 16  Ht 5\' 8"  (1.727 m)  Wt 239 lb (108.41 kg)  BMI 36.35 kg/m2  SpO2 98%  LMP 08/26/2014  Physical Exam  Constitutional: She is oriented to person, place, and time. She appears well-developed and well-nourished.  HENT:  Head: Normocephalic and atraumatic.  Eyes: Conjunctivae are normal. Pupils are equal, round, and reactive to light.  Pulmonary/Chest: Effort normal.  Musculoskeletal: She exhibits no edema.  Neurological: She is alert and oriented to person, place, and time.  Skin: Skin is dry.  Psychiatric: She has a normal mood and affect. Her behavior is normal. Thought content  normal.   She has some tenderness in her anterior shoulder. She also has some increased pain in her arm associated with Tinel test.    Assessment & Plan:   Randi was seen today for arm pain.  Diagnoses and all orders for this visit:  Cervical radicular pain  Other orders -     naproxen sodium (ANAPROX DS) 550 MG tablet; Take 1 tablet (550 mg total) by mouth 2 (two) times daily with a meal. -     cyclobenzaprine (FLEXERIL) 10 MG tablet; Take 1 tablet (10 mg total) by mouth 3 (three) times daily as needed for muscle spasms. -     HYDROcodone-acetaminophen (NORCO) 5-325 MG per tablet; Take 1-2 tablets by mouth every 4 (four) hours as needed.   I have discontinued [name]'s amoxicillin-clavulanate and pseudoephedrine. I am also having her start on naproxen sodium, cyclobenzaprine, and HYDROcodone-acetaminophen. Additionally, I am having her maintain her OVER THE COUNTER MEDICATION, OVER THE COUNTER MEDICATION, (Green Tea, Camillia sinensis, (GREEN TEA PO)), oxymetazoline, meclizine, and multivitamin.  Meds ordered this encounter  Medications  . naproxen sodium (ANAPROX DS) 550 MG tablet    Sig: Take 1 tablet (550 mg total) by mouth 2 (two) times daily with a meal.    Dispense:  40 tablet    Refill:  0  . cyclobenzaprine (FLEXERIL) 10 MG tablet    Sig: Take 1 tablet (10 mg total) by mouth 3 (three) times daily as needed for muscle spasms.    Dispense:  30 tablet    Refill:  0  . HYDROcodone-acetaminophen (NORCO) 5-325 MG per tablet    Sig: Take 1-2 tablets by mouth every 4 (four) hours as needed.    Dispense:  30 tablet    Refill:  0    I don't see any point doing radiographs of her neck given there is no history of injury. I suggested to her that we try her with conservative treatment over the weekend and if she still having problems and next week we can to imaging of her neck.  Appropriate red flag conditions were discussed with the patient as well as actions that should be  taken.  Patient expressed his understanding.  Follow-up: Return in about 1 week (around 09/03/2014).  Carmelina Dane, MD

## 2015-03-04 ENCOUNTER — Other Ambulatory Visit: Payer: Self-pay | Admitting: Family Medicine

## 2015-03-04 DIAGNOSIS — Z1231 Encounter for screening mammogram for malignant neoplasm of breast: Secondary | ICD-10-CM

## 2015-03-18 ENCOUNTER — Ambulatory Visit: Payer: BC Managed Care – PPO

## 2015-05-11 ENCOUNTER — Ambulatory Visit
Admission: RE | Admit: 2015-05-11 | Discharge: 2015-05-11 | Disposition: A | Discharge status: Home / Self Care | Payer: BC Managed Care – PPO | Source: Ambulatory Visit | Attending: Family Medicine | Admitting: Family Medicine

## 2015-05-11 DIAGNOSIS — Z1231 Encounter for screening mammogram for malignant neoplasm of breast: Secondary | ICD-10-CM

## 2015-05-13 ENCOUNTER — Other Ambulatory Visit: Payer: Self-pay | Admitting: Family Medicine

## 2015-05-13 DIAGNOSIS — R928 Other abnormal and inconclusive findings on diagnostic imaging of breast: Secondary | ICD-10-CM

## 2015-05-19 ENCOUNTER — Ambulatory Visit
Admission: RE | Admit: 2015-05-19 | Discharge: 2015-05-19 | Disposition: A | Discharge status: Home / Self Care | Payer: BC Managed Care – PPO | Source: Ambulatory Visit | Attending: Family Medicine | Admitting: Family Medicine

## 2015-05-19 DIAGNOSIS — R928 Other abnormal and inconclusive findings on diagnostic imaging of breast: Secondary | ICD-10-CM

## 2015-10-08 ENCOUNTER — Encounter: Payer: Self-pay | Admitting: Neurology

## 2015-10-08 ENCOUNTER — Ambulatory Visit: Payer: BC Managed Care – PPO | Admitting: Neurology

## 2015-10-08 VITALS — BP 125/84 | HR 74 | Ht 67.0 in | Wt 250.6 lb

## 2015-10-08 DIAGNOSIS — M5481 Occipital neuralgia: Secondary | ICD-10-CM | POA: Diagnosis not present

## 2015-10-08 DIAGNOSIS — G519 Disorder of facial nerve, unspecified: Secondary | ICD-10-CM

## 2015-10-08 DIAGNOSIS — G500 Trigeminal neuralgia: Secondary | ICD-10-CM | POA: Diagnosis not present

## 2015-10-08 DIAGNOSIS — R29810 Facial weakness: Secondary | ICD-10-CM

## 2015-10-08 DIAGNOSIS — Q670 Congenital facial asymmetry: Secondary | ICD-10-CM | POA: Diagnosis not present

## 2015-10-08 DIAGNOSIS — G510 Bell's palsy: Secondary | ICD-10-CM

## 2015-10-08 NOTE — Patient Instructions (Addendum)
Remember to drink plenty of fluid, eat healthy meals and do not skip any meals. Try to eat protein with a every meal and eat a healthy snack such as fruit or nuts in between meals. Try to keep a regular sleep-wake schedule and try to exercise daily, particularly in the form of walking, 20-30 minutes a day, if you can.   As far as diagnostic testing: MRI brain and labs  Our phone number is [phone]. We also have an after hours call service for urgent matters and there is a physician on-call for urgent questions. For any emergencies you know to call 911 or go to the nearest emergency room   

## 2015-10-08 NOTE — Progress Notes (Signed)
[facility]    Provider:  Dr Lucia GaskinsAhern Referring Provider: Laurann MontanaWhite, Cynthia, MD Primary Care Physician:  Cala BradfordWHITE,CYNTHIA S, MD  CC:  Occipital neuralgia  HPI:  Danielle Bauer is a 45 y.o. female here as a referral from Dr. Cliffton AstersWhite for occipital neuralgia. Past medical history of obesity, Bell's palsy. Symptoms started several years ago. Here with her husband who also provides information. She has sharp pain starting in the back of the head and shoots to the front of the scalp. Sharp, shooting, electric, brief, severe, so severe it takes her breath away. Paroxysmal. No known triggers. Would happen a few times a year like 3 times last year. Moving head back quickly may have been a trigger. The second week of August this year started having them every day. She was even laying in bed and had one. She had a prednisone taper still having the episodes but not as sharp. No inciting events, no head trauma. Now her head is hurting on the right side continuously. This week on Monday she started feeling her bottom right lip numb. Tuesday her left side of the face began to tingle and it startled her, unknown inciting event, no previous illnesses or new medications. She was diagnosed with another bells palsy on the left.  She has a remote Hx of Bell's Palsy on the left side of the face. Now she has repeat Bell's Palsy on the left. Worsening occipital neuralgia in frequency. No other associated or modifying factors. No other focal neurologic deficits or complaints. No FHx of neuropathy. No pertinent family history. No neck pain.  Reviewed notes, labs and imaging from outside physicians, which showed:  Creatinine 1.07, TSH 1.3 03/04/2015  Reviewed his physicians. Patient has had several appointments for sharp pains area and patient was tried on prednisone which improved the pain that the pain still. The pain is sharp piercing in the base of her skull on the right. She is back of her head and around her  eye. Severe, brief, last just a few seconds. First time was 2-3 years ago often noticed when she would drive to her sons will pick him up. Split-night past few weeks. Worsening. Has had before in the past 2-3 weeks. Reviewed exam which showed multiple Gen. appearance. Ears eyes is through, neck, heart, abdomen, extremities, neurologic. Patient's symptoms most consistent with occipital neuralgia. Not frequent enough at this point to justify starting her daily medication. Prednisone taper was given. Referral was made to neurology.  Review of Systems: Patient complains of symptoms per HPI as well as the following symptoms: no CO, no SOB. Pertinent negatives per HPI. All others negative.   Social History   Social History  . Marital status: Married    Spouse name: N/A  . Number of children: 3  . Years of education: Masters   Occupational History  . Counseling educatoin    Social History Main Topics  . Smoking status: Never Smoker  . Smokeless tobacco: Never Used  . Alcohol use No  . Drug use: No  . Sexual activity: Yes   Other Topics Concern  . Not on file   Social History Narrative   Lives with husband and 3 children   Caffeine use: Drinks tea daily   No soda    Family History  Problem Relation Age of Onset  . Neuropathy Neg Hx     Past Medical History:  Diagnosis Date  . Occipital neuralgia     Past Surgical History:  Procedure Laterality Date  .  WISDOM TOOTH EXTRACTION     x4    Current Outpatient Prescriptions  Medication Sig Dispense Refill  . Multiple Vitamin (MULTIVITAMIN) capsule Take 1 capsule by mouth daily.    Marland Kitchen OVER THE COUNTER MEDICATION Take 1 Package by mouth daily. Green vibrance     No current facility-administered medications for this visit.     Allergies as of 10/08/2015  . (No Known Allergies)    Vitals: BP 125/84 (BP Location: Right Arm, Patient Position: Sitting, Cuff Size: Normal)   Pulse 74   Ht 5\' 7"  (1.702 m)   Wt 250 lb 9.6 oz  (113.7 kg)   BMI 39.25 kg/m  Last Weight:  Wt Readings from Last 1 Encounters:  10/08/15 250 lb 9.6 oz (113.7 kg)   Last Height:   Ht Readings from Last 1 Encounters:  10/08/15 5\' 7"  (1.702 m)   Physical exam: Exam: Gen: NAD, conversant, well nourised, obese, well groomed                     CV: RRR, no MRG. No Carotid Bruits. No peripheral edema, warm, nontender Eyes: Conjunctivae clear without exudates or hemorrhage  Neuro: Detailed Neurologic Exam  Speech:    Speech is normal; fluent and spontaneous with normal comprehension.  Cognition:    The patient is oriented to person, place, and time;     recent and remote memory intact;     language fluent;     normal attention, concentration,     fund of knowledge Cranial Nerves:    The pupils are equal, round, and reactive to light. The fundi are normal and spontaneous venous pulsations are present. Visual fields are full to finger confrontation. Extraocular movements are intact. NL flattening, sensory changes left face, muscles of mastication are normal. Mildly impaired eyebrow raise on left. The palate elevates in the midline. Hearing intact. Voice is normal. Shoulder shrug is normal. The tongue has normal motion without fasciculations.   Coordination:    Normal finger to nose and heel to shin. Normal rapid alternating movements.   Gait:    Heel-toe and tandem gait are normal.   Motor Observation:    No asymmetry, no atrophy, and no involuntary movements noted. Tone:    Normal muscle tone.    Posture:    Posture is normal. normal erect    Strength:    Strength is V/V in the upper and lower limbs.      Sensation: intact to LT     Reflex Exam:  DTR's:    Deep tendon reflexes in the upper and lower extremities are normal bilaterally.   Toes:    The toes are downgoing bilaterally.   Clonus:    Clonus is absent.       Assessment/Plan:  45 year old with chronic onset occipital neuralgia. Neuro exam is non focal.  Pain is right-sided and is located in the distribution of the greater, lesser and/or third occipital nerves, paroxysmal and brief, painful, sharp, with tenderness and trigger points at the emergence of the greater occipital nerve. Differential includes pain in the upper cervical joints or disks, suboccipital or upper posterior neck muscles including the traps/scm, spinal and posterior cranial fossa dura mater, vertebral arteries, structural and infiltrative lesions such as meningioma, schwannoma, myelitis, compressive disk disease and others. Will need MRI of the brain, no neck pain so will defer mri cervical spine, asked her to consider occipital nerve blocks. For her repeat bells palsy, will check labs. Also  given her facial sensory changes will review mri of the brain for any other cause of her possible multiple cranial nerve complaints.   Cc: Dr. Donata Clay, MD  [facility] [address] [place], [place] [address]  Phone [phone] Fax [phone]

## 2015-10-10 ENCOUNTER — Encounter: Payer: Self-pay | Admitting: Neurology

## 2015-10-10 DIAGNOSIS — M5481 Occipital neuralgia: Secondary | ICD-10-CM | POA: Insufficient documentation

## 2015-10-10 DIAGNOSIS — G510 Bell's palsy: Secondary | ICD-10-CM | POA: Insufficient documentation

## 2015-10-10 LAB — ANA W/REFLEX: Anti Nuclear Antibody(ANA): NEGATIVE

## 2015-10-10 LAB — ANGIOTENSIN CONVERTING ENZYME: Angio Convert Enzyme: 66 U/L (ref 14–82)

## 2015-10-10 LAB — B. BURGDORFI ANTIBODIES: Lyme IgG/IgM Ab: <0.91 {ISR} (ref 0.00–0.90)

## 2015-10-10 LAB — SJOGREN'S SYNDROME ANTIBODS(SSA + SSB)
ENA SSA (RO) Ab: <0.2 AI (ref 0.0–0.9)
ENA SSB (LA) Ab: <0.2 AI (ref 0.0–0.9)

## 2015-10-12 ENCOUNTER — Telehealth: Payer: Self-pay | Admitting: *Deleted

## 2015-10-12 NOTE — Telephone Encounter (Signed)
Called and spoke to patient about normal labs per Dr Ahern. Pt verbalized understanding and has no further questions at this time. 

## 2015-10-12 NOTE — Telephone Encounter (Signed)
-----   Message from Anson FretAntonia B Ahern, MD sent at 10/11/2015  5:05 PM EDT ----- Labs normal thanks

## 2015-11-18 ENCOUNTER — Telehealth: Payer: Self-pay | Admitting: Neurology

## 2015-11-18 ENCOUNTER — Other Ambulatory Visit: Payer: Self-pay

## 2015-11-18 MED ORDER — ALPRAZOLAM 0.5 MG PO TABS: 0.5000 mg | ORAL_TABLET | Freq: Two times a day (BID) | ORAL | 0 refills | Status: DC | PRN

## 2015-11-18 NOTE — Telephone Encounter (Signed)
Pt called said she is having MRI tomorrow check in @10 :45. She is claustrophobic and [facility] [address]

## 2015-11-18 NOTE — Telephone Encounter (Signed)
Rn call patient that [facility] will rx [facility]. Rn stated she cannot drive to and from the testing while on this medication. PT stated her husband will be driving her. [facility].

## 2015-11-18 NOTE — Telephone Encounter (Signed)
RX done by Dr.Xu for Dr. Lucia GaskinsAhern. Pt having MRI tomorrow. Need something for being claustrophobic. Pt only given 2 pills and no refills. Rn will call patient that she cannot drive while taking this medication [facility]

## 2015-11-19 ENCOUNTER — Ambulatory Visit
Admission: RE | Admit: 2015-11-19 | Discharge: 2015-11-19 | Disposition: A | Discharge status: Home / Self Care | Payer: BC Managed Care – PPO | Source: Ambulatory Visit | Attending: Neurology | Admitting: Neurology

## 2015-11-19 DIAGNOSIS — M5481 Occipital neuralgia: Secondary | ICD-10-CM | POA: Diagnosis not present

## 2015-11-19 DIAGNOSIS — R29810 Facial weakness: Secondary | ICD-10-CM

## 2015-11-19 DIAGNOSIS — G500 Trigeminal neuralgia: Secondary | ICD-10-CM

## 2015-11-19 DIAGNOSIS — Q670 Congenital facial asymmetry: Secondary | ICD-10-CM

## 2015-11-19 DIAGNOSIS — G510 Bell's palsy: Secondary | ICD-10-CM

## 2015-11-19 MED ORDER — GADOBENATE DIMEGLUMINE 529 MG/ML IV SOLN
20.0000 mL | Freq: Once | INTRAVENOUS | Status: AC | PRN
  Administered 2015-11-19: 20 mL via INTRAVENOUS

## 2015-11-22 ENCOUNTER — Telehealth: Payer: Self-pay | Admitting: *Deleted

## 2015-11-22 NOTE — Telephone Encounter (Signed)
Called and spoke to pt about MRI results per Dr Lucia GaskinsAhern note. Advised pt to proceed to PCP/ENT if she is having any problems with sinuses to be further evaluated. She verbalized understanding.

## 2015-11-22 NOTE — Telephone Encounter (Signed)
-----   Message from Anson FretAntonia B Ahern, MD sent at 11/22/2015 11:28 AM EDT ----- Mri of the brain is normal for age.  Moderate chronic sphenoid and ethmoid sinusitis is incidentally noted. thanks

## 2016-01-12 ENCOUNTER — Ambulatory Visit: Payer: BC Managed Care – PPO | Admitting: Neurology

## 2016-01-12 ENCOUNTER — Telehealth: Payer: Self-pay

## 2016-01-12 NOTE — Telephone Encounter (Signed)
Patient did not show to appt today  

## 2016-01-14 ENCOUNTER — Encounter: Payer: Self-pay | Admitting: Neurology

## 2016-03-06 ENCOUNTER — Other Ambulatory Visit: Payer: Self-pay | Admitting: Family Medicine

## 2016-03-06 ENCOUNTER — Other Ambulatory Visit (HOSPITAL_COMMUNITY)
Admission: RE | Admit: 2016-03-06 | Discharge: 2016-03-06 | Disposition: A | Discharge status: Home / Self Care | Payer: BC Managed Care – PPO | Source: Ambulatory Visit | Attending: Family Medicine | Admitting: Family Medicine

## 2016-03-06 DIAGNOSIS — Z124 Encounter for screening for malignant neoplasm of cervix: Secondary | ICD-10-CM | POA: Diagnosis not present

## 2016-03-07 LAB — CYTOLOGY - PAP: Diagnosis: NEGATIVE

## 2016-03-09 ENCOUNTER — Other Ambulatory Visit: Payer: Self-pay | Admitting: Family Medicine

## 2016-03-09 DIAGNOSIS — R921 Mammographic calcification found on diagnostic imaging of breast: Secondary | ICD-10-CM

## 2016-05-04 ENCOUNTER — Ambulatory Visit
Admission: RE | Admit: 2016-05-04 | Discharge: 2016-05-04 | Disposition: A | Discharge status: Home / Self Care | Payer: BC Managed Care – PPO | Source: Ambulatory Visit | Attending: Family Medicine | Admitting: Family Medicine

## 2016-05-04 ENCOUNTER — Other Ambulatory Visit: Payer: Self-pay | Admitting: Family Medicine

## 2016-05-04 DIAGNOSIS — R921 Mammographic calcification found on diagnostic imaging of breast: Secondary | ICD-10-CM

## 2016-06-14 ENCOUNTER — Ambulatory Visit
Admission: RE | Admit: 2016-06-14 | Discharge: 2016-06-14 | Disposition: A | Discharge status: Home / Self Care | Payer: BC Managed Care – PPO | Source: Ambulatory Visit | Attending: Family Medicine | Admitting: Family Medicine

## 2016-06-14 DIAGNOSIS — R921 Mammographic calcification found on diagnostic imaging of breast: Secondary | ICD-10-CM

## 2017-11-06 ENCOUNTER — Other Ambulatory Visit: Payer: Self-pay | Admitting: Family Medicine

## 2017-11-06 DIAGNOSIS — Z1231 Encounter for screening mammogram for malignant neoplasm of breast: Secondary | ICD-10-CM

## 2017-12-07 ENCOUNTER — Ambulatory Visit
Admission: RE | Admit: 2017-12-07 | Discharge: 2017-12-07 | Disposition: A | Discharge status: Home / Self Care | Payer: BC Managed Care – PPO | Source: Ambulatory Visit | Attending: Family Medicine | Admitting: Family Medicine

## 2017-12-07 ENCOUNTER — Encounter: Payer: Self-pay | Admitting: Radiology

## 2017-12-07 DIAGNOSIS — Z1231 Encounter for screening mammogram for malignant neoplasm of breast: Secondary | ICD-10-CM

## 2018-12-22 ENCOUNTER — Other Ambulatory Visit: Payer: Self-pay | Admitting: Cardiology

## 2018-12-22 DIAGNOSIS — Z20822 Contact with and (suspected) exposure to covid-19: Secondary | ICD-10-CM

## 2018-12-23 LAB — NOVEL CORONAVIRUS, NAA: SARS-CoV-2, NAA: NOT DETECTED

## 2019-08-01 ENCOUNTER — Other Ambulatory Visit: Payer: Self-pay | Admitting: Family Medicine

## 2019-08-01 DIAGNOSIS — Z1231 Encounter for screening mammogram for malignant neoplasm of breast: Secondary | ICD-10-CM

## 2019-08-15 ENCOUNTER — Other Ambulatory Visit: Payer: Self-pay

## 2019-08-15 ENCOUNTER — Ambulatory Visit
Admission: RE | Admit: 2019-08-15 | Discharge: 2019-08-15 | Disposition: A | Discharge status: Home / Self Care | Payer: BC Managed Care – PPO | Source: Ambulatory Visit | Attending: Family Medicine | Admitting: Family Medicine

## 2019-08-15 DIAGNOSIS — Z1231 Encounter for screening mammogram for malignant neoplasm of breast: Secondary | ICD-10-CM

## 2019-12-24 ENCOUNTER — Other Ambulatory Visit (HOSPITAL_COMMUNITY)
Admission: RE | Admit: 2019-12-24 | Discharge: 2019-12-24 | Disposition: A | Discharge status: Home / Self Care | Payer: BC Managed Care – PPO | Source: Ambulatory Visit | Attending: Family Medicine | Admitting: Family Medicine

## 2019-12-24 ENCOUNTER — Other Ambulatory Visit: Payer: Self-pay | Admitting: Family Medicine

## 2019-12-24 DIAGNOSIS — Z124 Encounter for screening for malignant neoplasm of cervix: Secondary | ICD-10-CM | POA: Insufficient documentation

## 2019-12-31 LAB — CYTOLOGY - PAP
Comment: NEGATIVE
Diagnosis: NEGATIVE
High risk HPV: NEGATIVE

## 2020-03-19 ENCOUNTER — Other Ambulatory Visit: Payer: Self-pay

## 2020-03-19 ENCOUNTER — Encounter (HOSPITAL_COMMUNITY): Payer: Self-pay | Admitting: *Deleted

## 2020-03-19 ENCOUNTER — Emergency Department (HOSPITAL_COMMUNITY): Payer: BC Managed Care – PPO

## 2020-03-19 ENCOUNTER — Emergency Department (HOSPITAL_COMMUNITY)
Admission: EM | Admit: 2020-03-19 | Discharge: 2020-03-19 | Disposition: A | Discharge status: Home / Self Care | Payer: BC Managed Care – PPO | Attending: Emergency Medicine | Admitting: Emergency Medicine

## 2020-03-19 DIAGNOSIS — N939 Abnormal uterine and vaginal bleeding, unspecified: Secondary | ICD-10-CM | POA: Diagnosis present

## 2020-03-19 LAB — CBC WITH DIFFERENTIAL/PLATELET
Abs Immature Granulocytes: 0.01 10*3/uL (ref 0.00–0.07)
Basophils Absolute: 0 10*3/uL (ref 0.0–0.1)
Basophils Relative: 0 %
Eosinophils Absolute: 0.1 10*3/uL (ref 0.0–0.5)
Eosinophils Relative: 1 %
HCT: 39.1 % (ref 36.0–46.0)
Hemoglobin: 12.4 g/dL (ref 12.0–15.0)
Immature Granulocytes: 0 %
Lymphocytes Relative: 33 %
Lymphs Abs: 1.8 10*3/uL (ref 0.7–4.0)
MCH: 23.5 pg — ABNORMAL LOW (ref 26.0–34.0)
MCHC: 31.7 g/dL (ref 30.0–36.0)
MCV: 74.1 fL — ABNORMAL LOW (ref 80.0–100.0)
Monocytes Absolute: 0.3 10*3/uL (ref 0.1–1.0)
Monocytes Relative: 6 %
Neutro Abs: 3.3 10*3/uL (ref 1.7–7.7)
Neutrophils Relative %: 60 %
Platelets: 225 10*3/uL (ref 150–400)
RBC: 5.28 MIL/uL — ABNORMAL HIGH (ref 3.87–5.11)
RDW: 20.6 % — ABNORMAL HIGH (ref 11.5–15.5)
WBC: 5.5 10*3/uL (ref 4.0–10.5)
nRBC: 0 % (ref 0.0–0.2)

## 2020-03-19 LAB — I-STAT BETA HCG BLOOD, ED (MC, WL, AP ONLY): I-stat hCG, quantitative: <5 m[IU]/mL (ref <5)

## 2020-03-19 LAB — COMPREHENSIVE METABOLIC PANEL
ALT: 9 U/L (ref 0–44)
AST: 15 U/L (ref 15–41)
Albumin: 4 g/dL (ref 3.5–5.0)
Alkaline Phosphatase: 66 U/L (ref 38–126)
Anion gap: 9 (ref 5–15)
BUN: 8 mg/dL (ref 6–20)
CO2: 22 mmol/L (ref 22–32)
Calcium: 9 mg/dL (ref 8.9–10.3)
Chloride: 108 mmol/L (ref 98–111)
Creatinine, Ser: 0.92 mg/dL (ref 0.44–1.00)
GFR, Estimated: >60 mL/min (ref >60)
Glucose, Bld: 90 mg/dL (ref 70–99)
Potassium: 4.3 mmol/L (ref 3.5–5.1)
Sodium: 139 mmol/L (ref 135–145)
Total Bilirubin: 0.7 mg/dL (ref 0.3–1.2)
Total Protein: 7.3 g/dL (ref 6.5–8.1)

## 2020-03-19 LAB — WET PREP, GENITAL
Clue Cells Wet Prep HPF POC: NONE SEEN
Sperm: NONE SEEN
Trich, Wet Prep: NONE SEEN
WBC, Wet Prep HPF POC: NONE SEEN
Yeast Wet Prep HPF POC: NONE SEEN

## 2020-03-19 LAB — TYPE AND SCREEN
ABO/RH(D): B POS
Antibody Screen: NEGATIVE

## 2020-03-19 LAB — POC OCCULT BLOOD, ED: Fecal Occult Bld: POSITIVE — AB

## 2020-03-19 NOTE — ED Notes (Signed)
Patient transported to Ultrasound 

## 2020-03-19 NOTE — ED Provider Notes (Addendum)
[facility]-EMERGENCY DEPT Provider Note   CSN: [id number] Arrival date & time: 03/19/20  1108     History Chief Complaint  Patient presents with  . Rectal Bleeding    Danielle Bauer is a 50 y.o. female.  HPI      Danielle Bauer is a 50 y.o. female, patient with no pertinent, diagnosed past medical history, presenting to the ED with rectal and/or vaginal bleeding beginning yesterday afternoon.  Patient states she underwent routine, age-related colonoscopy yesterday morning (Dr. Marca Ancona, [facility]).  After the colonoscopy around 1 PM, she states she was experiencing rectal bleeding, followed closely by vaginal bleeding.  Initially, she states she was just spotting, but soaked a pad overnight. She has only had to use 1 pad this morning prior to ED arrival. She endorses some lower abdominal cramping, "like a menstrual cycle."  However, she states she has not had a menstrual cycle in almost 2 years. She has not had any vaginal bleeding since then. She has eaten since the colonoscopy, but not has not yet had a bowel movement. Denies fever/chills, other abdominal pain, chest pain, shortness of breath, dizziness, syncope, or any other complaints.  Past Medical History:  Diagnosis Date  . Occipital neuralgia     Patient Active Problem List   Diagnosis Date Noted  . Occipital neuralgia 10/10/2015  . Bell's palsy 10/10/2015    Past Surgical History:  Procedure Laterality Date  . WISDOM TOOTH EXTRACTION     x4     OB History   No obstetric history on file.     Family History  Problem Relation Age of Onset  . Breast cancer Maternal Aunt   . Neuropathy Neg Hx     Social History   Tobacco Use  . Smoking status: Never Smoker  . Smokeless tobacco: Never Used  Substance Use Topics  . Alcohol use: No  . Drug use: No    Home Medications Prior to Admission medications   Medication Sig Start Date End Date Taking? Authorizing Provider  ALPRAZolam Prudy Feeler)  0.5 MG tablet Take 1 tablet (0.5 mg total) by mouth 2 (two) times daily as needed for anxiety. Patient will take one pill one the way to testing. Pt can take one pill prior to testing.Pt cannot drive while taking this medication. She will need someone to drive her to and from the test for MRI. This medication can cause drowsiness. 11/18/15   Marvel Plan, MD  Multiple Vitamin (MULTIVITAMIN) capsule Take 1 capsule by mouth daily.    [provider]  OVER THE COUNTER MEDICATION Take 1 Package by mouth daily. Green vibrance    [provider]    Allergies    Patient has no known allergies.  Review of Systems   Review of Systems  Constitutional: Negative for chills, diaphoresis and fever.  Respiratory: Negative for shortness of breath.   Cardiovascular: Negative for chest pain.  Gastrointestinal: Negative for abdominal pain, diarrhea, nausea and vomiting.  Genitourinary: Positive for vaginal bleeding. Negative for difficulty urinating and dysuria.  Musculoskeletal: Negative for back pain.  Neurological: Negative for dizziness, syncope and weakness.  All other systems reviewed and are negative.   Physical Exam Updated Vital Signs BP (!) 134/103 (BP Location: Left Arm)   Pulse 83   Temp 98.1 F (36.7 C) (Oral)   Resp (!) 25   Ht 5\' 7"  (1.702 m)   Wt 114.3 kg   SpO2 99%   BMI 39.47 kg/m  Physical Exam Vitals and nursing note reviewed.  Constitutional:      General: She is not in acute distress.    Appearance: She is well-developed. She is not diaphoretic.  HENT:     Head: Normocephalic and atraumatic.     Mouth/Throat:     Mouth: Mucous membranes are moist.     Pharynx: Oropharynx is clear.  Eyes:     Conjunctiva/sclera: Conjunctivae normal.  Cardiovascular:     Rate and Rhythm: Normal rate and regular rhythm.     Pulses: Normal pulses.          Radial pulses are 2+ on the right side and 2+ on the left side.  Pulmonary:     Effort: Pulmonary effort is  normal. No respiratory distress.  Abdominal:     Palpations: Abdomen is soft.     Tenderness: There is no abdominal tenderness. There is no guarding.  Genitourinary:    Comments: External genitalia normal Vagina with liquid bright red blood with small amount of pooling in vaginal vault.  No brisk hemorrhage noted. Cervix bleeding appears to be coming from the cervical os. negative for cervical motion tenderness Adnexa palpated, no masses, negative for tenderness noted Bladder palpated negative for tenderness Uterus palpated no masses, negative for tenderness  No inguinal lymphadenopathy. Otherwise normal female genitalia. RN, Karleen HampshireSpencer, served as Biomedical engineerchaperone during exam.  Rectal Exam:  No external hemorrhoids, fissures, or lesions noted.  It does not seem as though patient has hemorrhage from the rectum.      RN, Karleen HampshireSpencer, served as chaperone during the rectal exam. Musculoskeletal:     Cervical back: Neck supple.  Skin:    General: Skin is warm and dry.  Neurological:     Mental Status: She is alert.  Psychiatric:        Mood and Affect: Mood and affect normal.        Speech: Speech normal.        Behavior: Behavior normal.     ED Results / Procedures / Treatments   Labs (all labs ordered are listed, but only abnormal results are displayed) Labs Reviewed  CBC WITH DIFFERENTIAL/PLATELET - Abnormal; Notable for the following components:      Result Value   RBC 5.28 (*)    MCV 74.1 (*)    MCH 23.5 (*)    RDW 20.6 (*)    All other components within normal limits  POC OCCULT BLOOD, ED - Abnormal; Notable for the following components:   Fecal Occult Bld POSITIVE (*)    All other components within normal limits  WET PREP, GENITAL  COMPREHENSIVE METABOLIC PANEL  I-STAT BETA HCG BLOOD, ED ([facility], [facility], [facility] ONLY)  TYPE AND SCREEN  GC/CHLAMYDIA PROBE AMP ([place]) NOT AT [facility]    EKG None  Radiology US Transvaginal Non-OB  Result Date: 03/19/2020 CLINICAL DATA:   Postmenopausal vaginal bleeding EXAM: TRANSABDOMINAL AND TRANSVAGINAL ULTRASOUND OF PELVIS TECHNIQUE: Study was performed transabdominally to optimize pelvic field of view evaluation and transvaginally to optimize internal visceral architecture evaluation. COMPARISON:  None FINDINGS: Uterus Measurements: 8.4 x 4.4 x 5.2 cm = volume: 102.0 mL. There is a hypoechoic mass in the inferior rightward aspect of the myometrium measuring 1.3 x 0.9 x 1.4 cm. Endometrium Thickness: 6 mm. Endometrial contour is smooth. There is mild fluid in the lower endometrial segment. Right ovary Measurements: 3.4 x 1.2 x 1.9 cm = volume: 4.1 mL. Normal appearance/no adnexal mass. Left ovary Measurements: 1.7 x 0.9 x 1.2  cm = volume: 1.0 mL. Normal appearance/no adnexal mass. Other findings No abnormal free fluid. IMPRESSION: 1. Apparent leiomyoma along the rightward lower uterine segment measuring 1.3 x 0.9 x 1.4 cm. Myometrium otherwise appears unremarkable. 2. Endometrium is mildly thickened given bleeding and postmenopausal status. Gynecologic consultation advised. This finding may warrant tissue sampling from the endometrium. Suspect mild hemorrhage in the lower endometrial segment. No mass or contour irregularity noted in the endometrium. 3.  No extrauterine pelvic mass or fluid. Electronically Signed   By: Bretta Bang III M.D.   On: 03/19/2020 15:25   US Pelvis Complete  Result Date: 03/19/2020 CLINICAL DATA:  Postmenopausal vaginal bleeding EXAM: TRANSABDOMINAL AND TRANSVAGINAL ULTRASOUND OF PELVIS TECHNIQUE: Study was performed transabdominally to optimize pelvic field of view evaluation and transvaginally to optimize internal visceral architecture evaluation. COMPARISON:  None FINDINGS: Uterus Measurements: 8.4 x 4.4 x 5.2 cm = volume: 102.0 mL. There is a hypoechoic mass in the inferior rightward aspect of the myometrium measuring 1.3 x 0.9 x 1.4 cm. Endometrium Thickness: 6 mm. Endometrial contour is smooth. There is  mild fluid in the lower endometrial segment. Right ovary Measurements: 3.4 x 1.2 x 1.9 cm = volume: 4.1 mL. Normal appearance/no adnexal mass. Left ovary Measurements: 1.7 x 0.9 x 1.2 cm = volume: 1.0 mL. Normal appearance/no adnexal mass. Other findings No abnormal free fluid. IMPRESSION: 1. Apparent leiomyoma along the rightward lower uterine segment measuring 1.3 x 0.9 x 1.4 cm. Myometrium otherwise appears unremarkable. 2. Endometrium is mildly thickened given bleeding and postmenopausal status. Gynecologic consultation advised. This finding may warrant tissue sampling from the endometrium. Suspect mild hemorrhage in the lower endometrial segment. No mass or contour irregularity noted in the endometrium. 3.  No extrauterine pelvic mass or fluid. Electronically Signed   By: Bretta Bang III M.D.   On: 03/19/2020 15:25    Procedures Pelvic exam  Date/Time: 03/19/2020 12:20 PM Performed by: Anselm Pancoast, PA-C Authorized by: Anselm Pancoast, PA-C  Consent: Verbal consent obtained. Risks and benefits: risks, benefits and alternatives were discussed Consent given by: patient Patient identity confirmed: verbally with patient and provided demographic data Local anesthesia used: no  Anesthesia: Local anesthesia used: no  Sedation: Patient sedated: no  Patient tolerance: patient tolerated the procedure well with no immediate complications      Medications Ordered in ED Medications - No data to display  ED Course  I have reviewed the triage vital signs and the nursing notes.  Pertinent labs & imaging results that were available during my care of the patient were reviewed by me and considered in my medical decision making (see chart for details).  Clinical Course as of 03/19/20 1641  Fri Mar 19, 2020  1544 Spoke with Misty Stanley, answering for Dr. Su Hilt, on call for [facility]. Dr. Su Hilt is in the OR and will call back when she can.  [SJ]  [id number] Spoke with Dr. Su Hilt, OBGYN.  States she agrees patient can follow-up in the office.  She will pass the patient's information on to her office and have them contact the patient on Monday to arrange follow-up. [SJ]    Clinical Course User Index [SJ] Joy, Hillard Danker, PA-C   MDM Rules/Calculators/A&P                          Patient presents with postmenopausal vaginal bleeding beginning yesterday. Patient is nontoxic appearing, afebrile, not tachycardic, not tachypneic, not hypotensive, maintains excellent SPO2  on room air, and is in no apparent distress.  Benign abdominal exam. I have reviewed the patient's chart to obtain more information.   I reviewed and interpreted the patient's labs and radiological studies. Lab work reassuring. Revonda Standard from [facility] came to assess the patient.  Her assessment indicates she does not think the patient's bleeding is rectal. Ultrasound with results indicating patient is next type is OB/GYN follow-up. I discussed with the patient that the on-call OB/GYN was not immediately available due to emergent surgery.  We discussed options for next steps.  Patient opted for discharge and office follow-up. Return precautions discussed.  Patient voices understanding of these instructions, accepts the plan, and is comfortable with discharge.    Vitals:   03/19/20 1302 03/19/20 1330 03/19/20 1503 03/19/20 1530  BP: 134/89 120/80 (!) 135/96 (!) 145/113  Pulse: 72 73 69 70  Resp: 13 18 18 18   Temp:      TempSrc:      SpO2: 99% 97% 99% 100%  Weight:      Height:         Final Clinical Impression(s) / ED Diagnoses Final diagnoses:  Vaginal bleeding    Rx / DC Orders ED Discharge Orders    None       03/19/20 1632    03/21/20, PA-C 03/19/20 1642    03/21/20, MD 03/23/20 1010

## 2020-03-19 NOTE — ED Notes (Signed)
Gastro PA at bedside 

## 2020-03-19 NOTE — ED Triage Notes (Signed)
Pt states she had a colonoscopy yesterday, about 1 pm yesterday she started to have rectal bleeding as well as having vag bleeding.

## 2020-03-19 NOTE — Consult Note (Addendum)
Referring Provider: ED Primary Care Physician:  Laurann Montana, MD Primary Gastroenterologist:  Dr. Marca Ancona [facility])  Reason for Consultation:  Question of rectal bleeding after colonoscopy  HPI: Danielle Bauer is a 50 y.o. female who underwent colonoscopy yesterday presenting with concern of rectal bleeding.  Patient called the GI office today and states she thought she was experiencing rectal bleeding and/or vaginal bleeding last night after her colonoscopy and was advised to present to the ED.  Upon my examination (see PE below), patient has vaginal bleeding and no definitive rectal bleeding.  FOBT was completed but may be positive from contamination from vaginal bleeding vs. Residual post-polypectomy.  Patient notes some lower abdominal cramping, which feels like menstrual cramping.  She states she has not had a menstrual cycle in over 1 year.  Denies any nausea or vomiting denies gastrointestinal complaints.  Past Medical History:  Diagnosis Date  . Occipital neuralgia     Past Surgical History:  Procedure Laterality Date  . WISDOM TOOTH EXTRACTION     x4    Prior to Admission medications   Medication Sig Start Date End Date Taking? Authorizing Provider  ALPRAZolam Prudy Feeler) 0.5 MG tablet Take 1 tablet (0.5 mg total) by mouth 2 (two) times daily as needed for anxiety. Patient will take one pill one the way to testing. Pt can take one pill prior to testing.Pt cannot drive while taking this medication. She will need someone to drive her to and from the test for MRI. This medication can cause drowsiness. 11/18/15   Marvel Plan, MD  Multiple Vitamin (MULTIVITAMIN) capsule Take 1 capsule by mouth daily.    [provider]  OVER THE COUNTER MEDICATION Take 1 Package by mouth daily. Green vibrance    [provider]    Scheduled Meds: Continuous Infusions: PRN Meds:.  Allergies as of 03/19/2020  . (No Known Allergies)    Family History  Problem Relation Age of  Onset  . Breast cancer Maternal Aunt   . Neuropathy Neg Hx     Social History   Socioeconomic History  . Marital status: Married    Spouse name: Not on file  . Number of children: 3  . Years of education: Masters  . Highest education level: Not on file  Occupational History  . Occupation: Counseling educatoin  Tobacco Use  . Smoking status: Never Smoker  . Smokeless tobacco: Never Used  Substance and Sexual Activity  . Alcohol use: No  . Drug use: No  . Sexual activity: Yes  Other Topics Concern  . Not on file  Social History Narrative   Lives with husband and 3 children   Caffeine use: Drinks tea daily   No soda   Social Determinants of Corporate investment banker Strain: Not on file  Food Insecurity: Not on file  Transportation Needs: Not on file  Physical Activity: Not on file  Stress: Not on file  Social Connections: Not on file  Intimate Partner Violence: Not on file    Review of Systems: Review of Systems  Constitutional: Negative for chills and fever.  HENT: Negative for hearing loss and tinnitus.   Eyes: Negative for pain and redness.  Respiratory: Negative for cough and shortness of breath.   Cardiovascular: Negative for chest pain and palpitations.  Gastrointestinal: Positive for abdominal pain. Negative for blood in stool, constipation, diarrhea, heartburn, melena, nausea and vomiting.  Genitourinary: Negative for flank pain and hematuria.  Musculoskeletal: Negative for back pain and joint pain.  Skin: Negative for itching and rash.  Neurological: Negative for seizures and loss of consciousness.  Endo/Heme/Allergies: Negative for polydipsia. Does not bruise/bleed easily.  Psychiatric/Behavioral: Negative for substance abuse. The patient is not nervous/anxious.      Physical Exam: Vital signs: Vitals:   03/19/20 1131 03/19/20 1200  BP: (!) 134/103 (!) 143/97  Pulse: 83 80  Resp: (!) 25 13  Temp: 98.1 F (36.7 C)   SpO2: 99% 98%       Physical Exam Exam conducted with a chaperone present.  Constitutional:      General: She is not in acute distress.    Appearance: She is overweight.  HENT:     Head: Normocephalic and atraumatic.     Nose: Nose normal. No congestion.     Mouth/Throat:     Mouth: Mucous membranes are moist.     Pharynx: Oropharynx is clear.  Eyes:     Extraocular Movements: Extraocular movements intact.     Conjunctiva/sclera: Conjunctivae normal.  Cardiovascular:     Rate and Rhythm: Normal rate and regular rhythm.     Pulses: Normal pulses.  Pulmonary:     Effort: Pulmonary effort is normal. No respiratory distress.     Breath sounds: Normal breath sounds.  Abdominal:     General: Bowel sounds are normal. There is no distension.     Palpations: Abdomen is soft. There is no mass.     Tenderness: There is no abdominal tenderness. There is no guarding or rebound.     Hernia: No hernia is present.  Genitourinary:    Vagina: Bleeding present.     Rectum: External hemorrhoid present. Normal anal tone.     Comments: RN Karleen Hampshire) present for rectal and vaginal exam.  Red blood pooling in vagina.  No rectal bleeding noted, scant brown stool in rectal vault but rectal vault otherwise empty. Musculoskeletal:        General: No swelling or tenderness.     Cervical back: Normal range of motion and neck supple.  Skin:    General: Skin is warm and dry.  Neurological:     General: No focal deficit present.     Mental Status: She is alert and oriented to person, place, and time.  Psychiatric:        Mood and Affect: Mood normal.        Behavior: Behavior normal. Behavior is cooperative.     GI:  Lab Results: No results for input(s): WBC, HGB, HCT, PLT in the last 72 hours. BMET No results for input(s): NA, K, CL, CO2, GLUCOSE, BUN, CREATININE, CALCIUM in the last 72 hours. LFT No results for input(s): PROT, ALBUMIN, AST, ALT, ALKPHOS, BILITOT, BILIDIR, IBILI in the last 72 hours. PT/INR No  results for input(s): LABPROT, INR in the last 72 hours.   Studies/Results: No results found.  Impression/Plan: Concern for post-polypectomy bleeding after colonoscopy 03/18/20, though per exam, no rectal bleeding - patient has vaginal bleeding.    FOBT may be positive from contamination (vaginal blood) and/or residual (old) blood from polypectomy.  Based on current findings, patient does not warrant repeat colonoscopic evaluation.  Recommend further management by ED providers +/- gyn.  Addendum: CBC revealed Hgb 12.4, stable as compared to 12.6 in 12/2019.  [facility] will sign off. Please contact us if we can be of any further assistance during this hospital stay.   LOS: 0 days   Edrick Kins  PA-C 03/19/2020, 12:13 PM  Contact #  [phone]

## 2020-03-19 NOTE — Discharge Instructions (Signed)
Follow-up with OB/GYN as soon as possible on this matter.  Call to make an appointment. Return to the emergency department for chest pain, shortness of breath, passing out, worsening abdominal pain, increased bleeding using more than 1 pad an hour, or any other major concerns.

## 2020-03-22 LAB — GC/CHLAMYDIA PROBE AMP (~~LOC~~) NOT AT ARMC
Chlamydia: NEGATIVE
Comment: NEGATIVE
Comment: NORMAL
Neisseria Gonorrhea: NEGATIVE

## 2020-03-24 ENCOUNTER — Other Ambulatory Visit: Payer: Self-pay | Admitting: Obstetrics and Gynecology

## 2021-11-07 ENCOUNTER — Other Ambulatory Visit: Payer: Self-pay | Admitting: Family Medicine

## 2021-11-07 DIAGNOSIS — Z1231 Encounter for screening mammogram for malignant neoplasm of breast: Secondary | ICD-10-CM

## 2021-12-12 ENCOUNTER — Ambulatory Visit: Payer: BC Managed Care – PPO

## 2022-02-08 ENCOUNTER — Ambulatory Visit
Admission: RE | Admit: 2022-02-08 | Discharge: 2022-02-08 | Disposition: A | Discharge status: Home / Self Care | Payer: BC Managed Care – PPO | Source: Ambulatory Visit | Attending: Family Medicine | Admitting: Family Medicine

## 2022-02-08 DIAGNOSIS — Z1231 Encounter for screening mammogram for malignant neoplasm of breast: Secondary | ICD-10-CM

## 2022-07-06 IMAGING — MG DIGITAL SCREENING BILAT W/ TOMO W/ CAD
8 series · 8 of 24 positions shown · non-contrast
Comparison: Previous exam(s).

CLINICAL DATA: Screening.

EXAM:
DIGITAL SCREENING BILATERAL MAMMOGRAM WITH TOMO AND CAD

[L CC synth-2D]
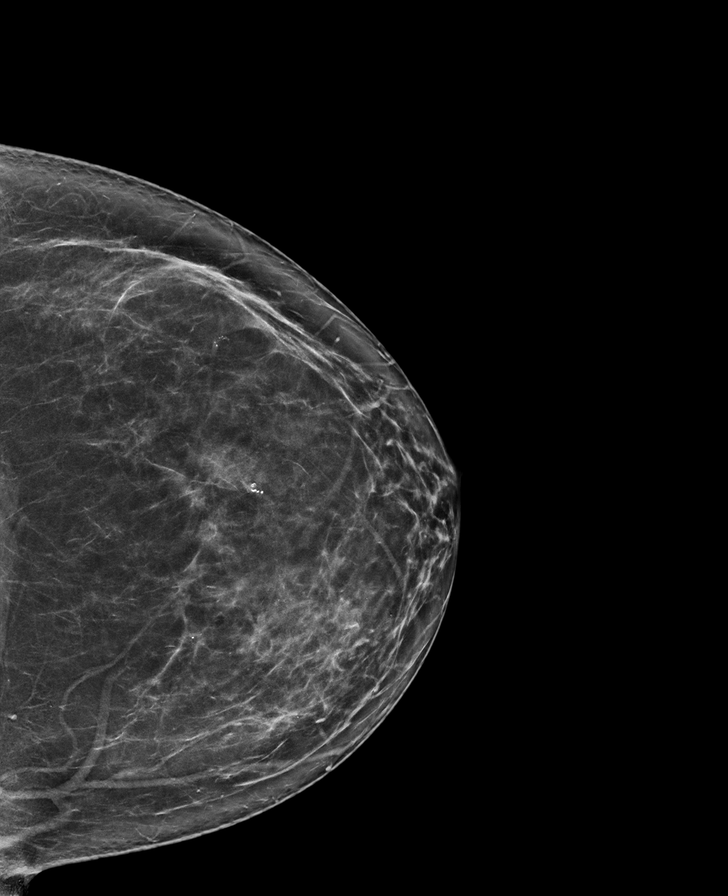

[L MLO synth-2D]
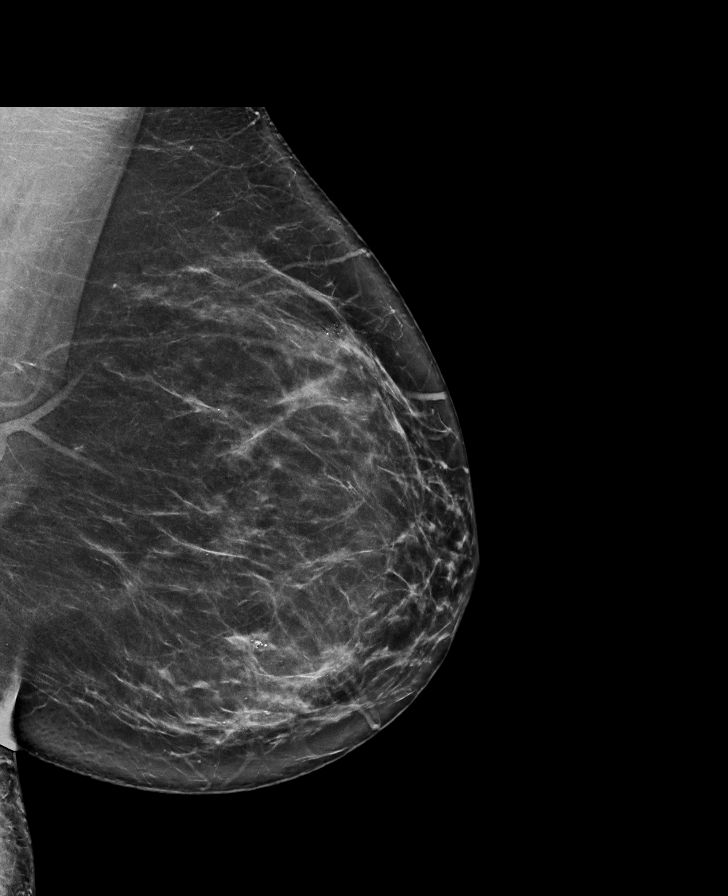

[R MLO synth-2D]
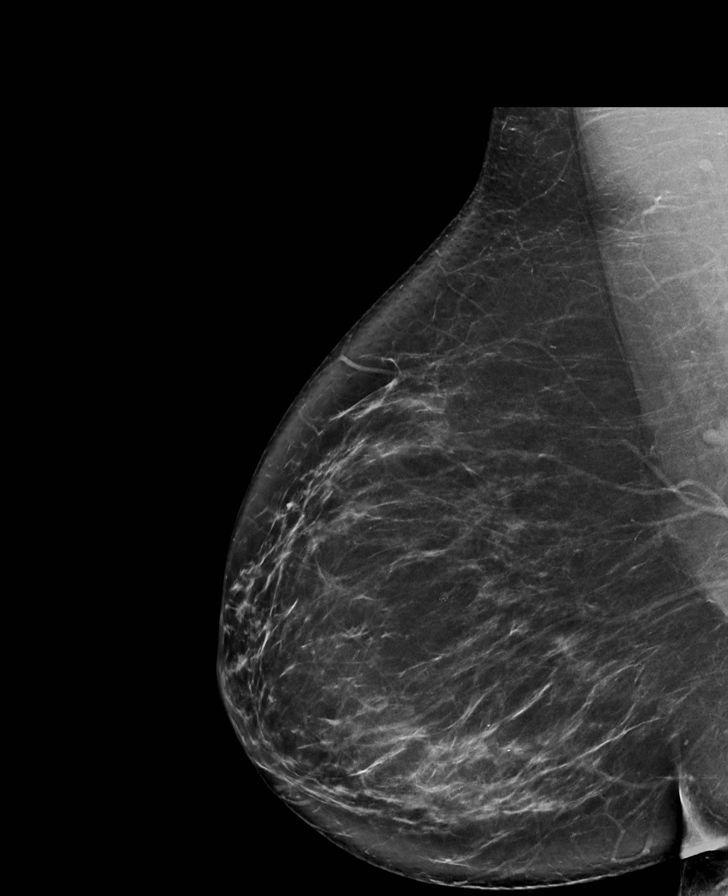

[R CC synth-2D]
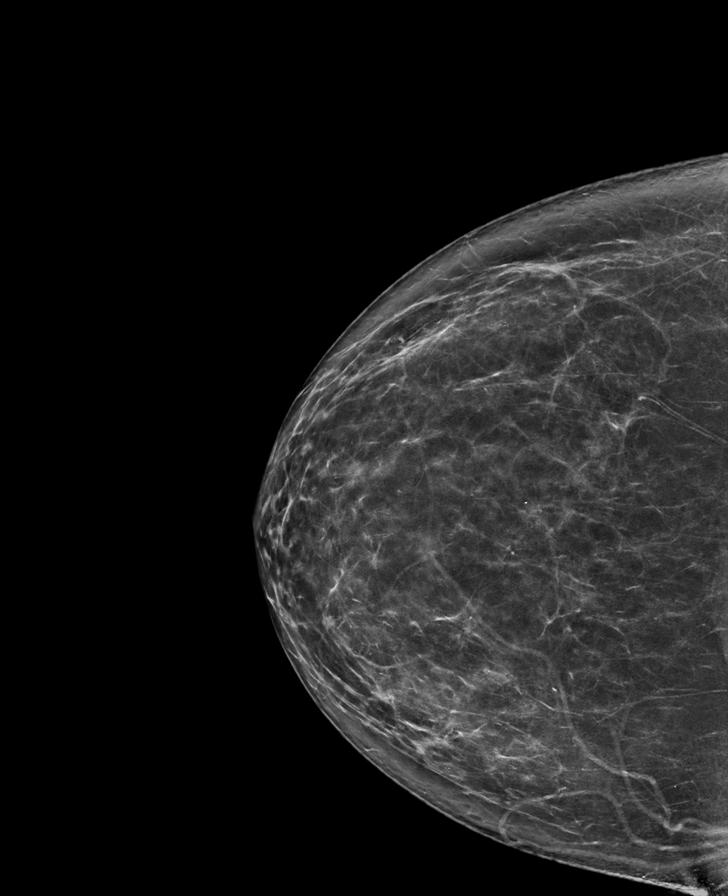

[R MLO tomo · tomo slice 46/91.0]
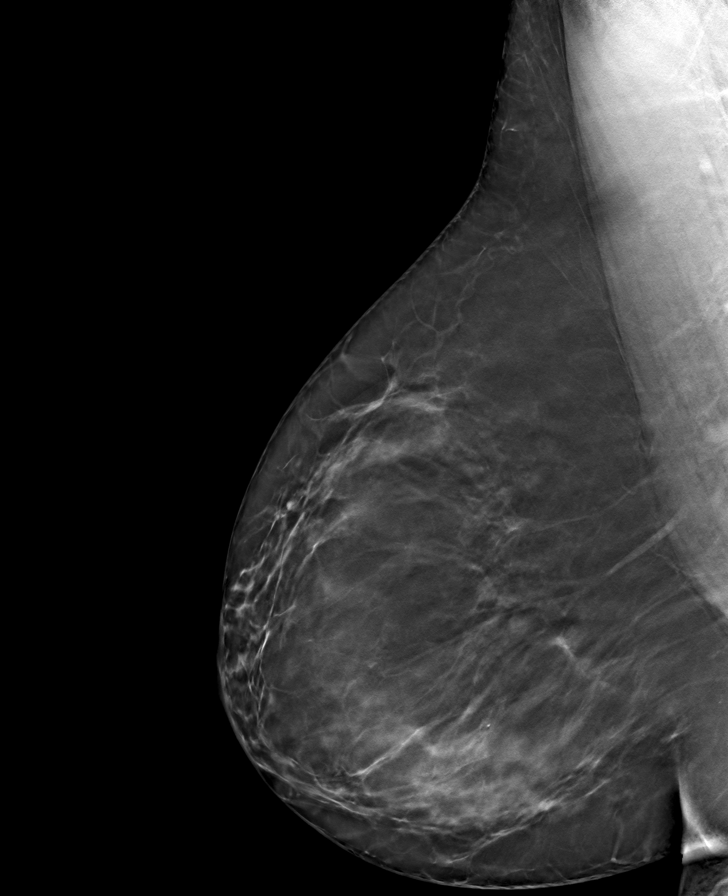

[R CC tomo · tomo slice 42/83.0]
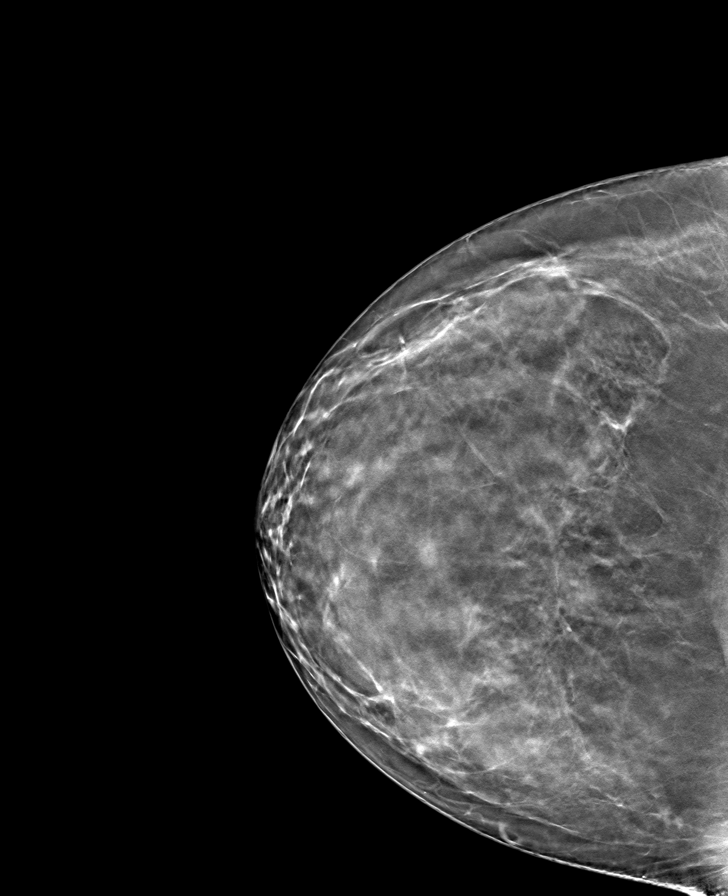

[L CC tomo · tomo slice 41/82.0]
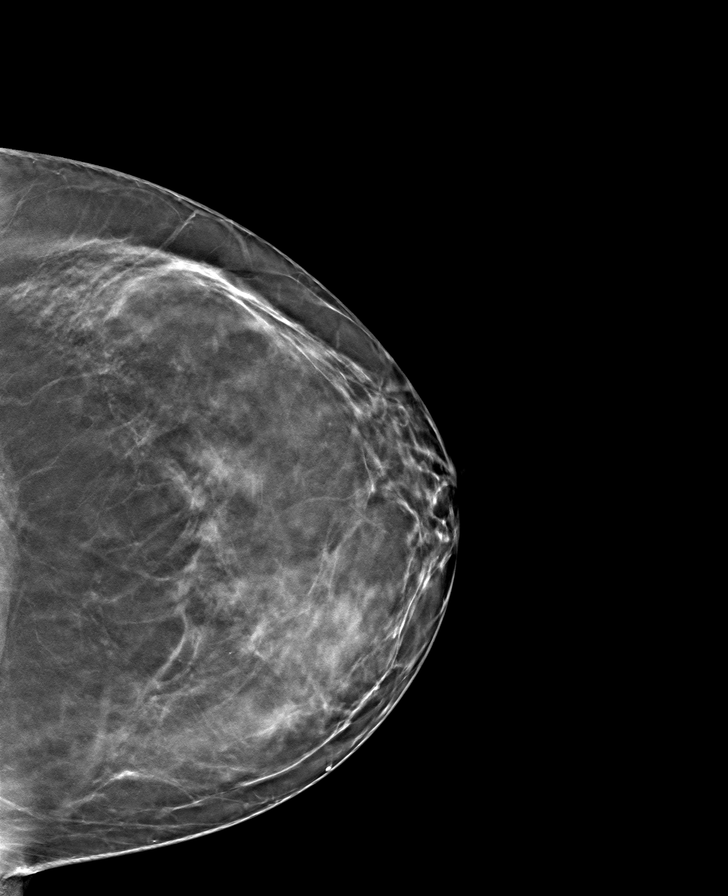

[L MLO tomo · tomo slice 44/87.0]
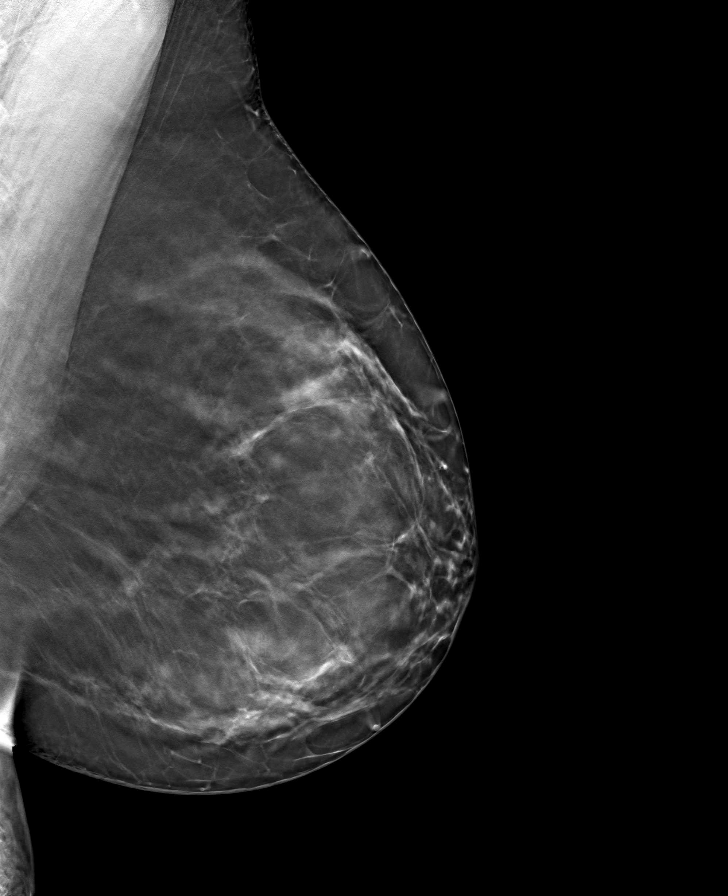

[8 of 24 positions shown; findings below may reference images not displayed]

ACR Breast Density Category b: There are scattered areas of
fibroglandular density.
FINDINGS: There are no findings suspicious for malignancy. Images were
processed with CAD.
IMPRESSION: No mammographic evidence of malignancy. A result letter of this
screening mammogram will be mailed directly to the patient.

RECOMMENDATION:
Screening mammogram in one year. (Code:[id number])

BI-RADS CATEGORY  1: Negative.

## 2023-01-18 ENCOUNTER — Other Ambulatory Visit: Payer: Self-pay

## 2023-01-18 ENCOUNTER — Encounter: Payer: Self-pay | Admitting: Infectious Diseases

## 2023-01-18 ENCOUNTER — Ambulatory Visit: Payer: BC Managed Care – PPO | Admitting: Infectious Diseases

## 2023-01-18 VITALS — BP 119/86 | HR 70 | Resp 16 | Ht 67.0 in | Wt 248.0 lb

## 2023-01-18 DIAGNOSIS — R490 Dysphonia: Secondary | ICD-10-CM

## 2023-01-18 DIAGNOSIS — Z8619 Personal history of other infectious and parasitic diseases: Secondary | ICD-10-CM

## 2023-01-18 DIAGNOSIS — N898 Other specified noninflammatory disorders of vagina: Secondary | ICD-10-CM

## 2023-01-18 DIAGNOSIS — Z79899 Other long term (current) drug therapy: Secondary | ICD-10-CM | POA: Insufficient documentation

## 2023-01-18 NOTE — Progress Notes (Signed)
Patient Active Problem List   Diagnosis Date Noted   Occipital neuralgia 10/10/2015   Bell's palsy 10/10/2015    Patient's Medications  New Prescriptions   No medications on file  Previous Medications   ALPRAZOLAM (XANAX) 0.5 MG TABLET    Take 1 tablet (0.5 mg total) by mouth 2 (two) times daily as needed for anxiety. Patient will take one pill one the way to testing. Pt can take one pill prior to testing.Pt cannot drive while taking this medication. She will need someone to drive her to and from the test for MRI. This medication can cause drowsiness.   MULTIPLE VITAMIN (MULTIVITAMIN) CAPSULE    Take 1 capsule by mouth daily.   OVER THE COUNTER MEDICATION    Take 1 Package by mouth daily. Green vibrance  Modified Medications   No medications on file  Discontinued Medications   No medications on file    Subjective Discussed the use of AI scribe software for clinical note transcription with the patient, who gave verbal consent to proceed.   52 Y O female with PMH of prediabetes who is referred from PCP for evaluation of possible recurrent thrush. Per outside records, seen 10/2021 for recurrent thrush but did not have white coating, only erythematous larynx. Reports seeing her Gyn on August when she was tested for STI panel and was negative. She later had cold/URI symptoms and was given Zpak by her Gyn. Had a negative home covid test. She also had loss of smell and taste that time, while other symptoms improved with Zpak, she continued to have burning sensation in her upper mouth and tongue. She was then seen by PA and appeared to have thrush on exam and hence prescribed clotrimazole troche. Contacted 10/17 improvement of symptoms with recurrence a day prior. She reached out to PCP again on 11/22 with recurrence of symptoms. She continued to have hoarse voice since September.Describes her symptoms of thrush as white coating in the tongue, pain in the mouth and altered taste sensation.  Many times she self-treats it with over-the-counter Monistat, which provides temporary relief. She also reports possoble vaginal yeast infections since her cold episode. Married and sexually active with her husband almost once a week and feels sensation of vaginal burning 1-2 days after sexual intercourse which is improved with monostat. Denies any vaginal discharge. Denies any burning with urination or urgency although she has to wake up at night to urinate. She reports recently being diagnosed with prediabetes. No  known immunocompromising conditions or on steroids. She has tested negative for HIV as well as normal immunoglobulins on 12/4.   She reports she had recurrent thrush for last couple of years even before August /Sept 2024 Reports GERD and feels liquid going down her throat when swallows but denies any heart burn. Denies any dysphagia or odynophagia. Denies any significant appetite or weight changes.   No new or active symptoms in her mouth or vagina and reports had taken 1 monostat 1-2 days prior.  Denies cough, chest pain or sob Denies headache, blurry vision or rashes or joint pain Denies smoking, alcohol and recreational drug use. She works as a Advertising account executive.   Review of Systems: all systems reviewed and negative except as above   Past Medical History:  Diagnosis Date   Occipital neuralgia    Past Surgical History:  Procedure Laterality Date   WISDOM TOOTH EXTRACTION     x4    Social History   Tobacco Use  Smoking status: Never   Smokeless tobacco: Never  Substance Use Topics   Alcohol use: No   Drug use: No    Family History  Problem Relation Age of Onset   Breast cancer Maternal Aunt    Neuropathy Neg Hx     No Known Allergies  Health Maintenance  Topic Date Due   HIV Screening  Never done   Hepatitis C Screening  Never done   Colonoscopy  Never done   Zoster Vaccines- Shingrix (1 of 2) Never done   INFLUENZA VACCINE  Never done   COVID-19  Vaccine (1 - 2024-25 season) Never done   DTaP/Tdap/Td (2 - Td or Tdap) 08/24/2023   MAMMOGRAM  02/09/2024   Cervical Cancer Screening (HPV/Pap Cotest)  12/23/2024   HPV VACCINES  Aged Out    Objective: BP 119/86   Pulse 70   Resp 16   Ht 5\' 7"  (1.702 m)   Wt 248 lb (112.5 kg)   LMP  (LMP Unknown)   BMI 38.84 kg/m    Physical Exam Constitutional:      Appearance: Normal appearance. Morbidly obese  HENT:     Head: Normocephalic and atraumatic.      Mouth: Mucous membranes are moist. No exam findings of thrush or white coating  Eyes:    Conjunctiva/sclera: Conjunctivae normal.     Pupils: Pupils are equal, round, and b/l symmetrical   Cardiovascular:     Rate and Rhythm: Normal rate and regular rhythm.     Heart sounds: s1s2  Pulmonary:     Effort: Pulmonary effort is normal.     Breath sounds: Normal breath sounds.   Abdominal:     General: Non distended     Palpations: soft. Non tender   Musculoskeletal:        General: Normal range of motion.   Skin:    General: Skin is warm and dry.     Comments:  Neurological:     General: grossly non focal     Mental Status: awake, alert and oriented to person, place, and time.   Psychiatric:        Mood and Affect: Mood normal.   Lab Results Lab Results  Component Value Date   WBC 5.5 03/19/2020   HGB 12.4 03/19/2020   HCT 39.1 03/19/2020   MCV 74.1 (L) 03/19/2020   PLT 225 03/19/2020    Lab Results  Component Value Date   CREATININE 0.92 03/19/2020   BUN 8 03/19/2020   NA 139 03/19/2020   K 4.3 03/19/2020   CL 108 03/19/2020   CO2 22 03/19/2020    Lab Results  Component Value Date   ALT 9 03/19/2020   AST 15 03/19/2020   ALKPHOS 66 03/19/2020   BILITOT 0.7 03/19/2020    No results found for: "CHOL", "HDL", "LDLCALC", "LDLDIRECT", "TRIG", "CHOLHDL" No results found for: "LABRPR", "RPRTITER" No results found for: "HIV1RNAQUANT", "HIV1RNAVL", "CD4TABS"   Assessment/Plan # H/o oral thrush #  Vaginal itching  # Hoarseness of voice   No known h/o DM or other immunocompromising conditions or on steroids for recurrent Thrush.  Suspect her hoarse voice could be also related to GERD  Plan Amb referral to GI. No indication of antifungals today Standing orders for vaginitis to r/o GC, Trich, candida when she has symptoms  Fu after GI evaluation   I have personally spent 67  minutes involved in face-to-face and non-face-to-face activities for this patient on the day of the visit. Professional  time spent includes the following activities: Preparing to see the patient (review of tests), Obtaining and/or reviewing separately obtained history (admission/discharge record), Performing a medically appropriate examination and/or evaluation , Ordering medications/tests/procedures, referring and communicating with other health care professionals, Documenting clinical information in the EMR, Independently interpreting results (not separately reported), Communicating results to the patient/family/caregiver, Counseling and educating the patient/family/caregiver and Care coordination (not separately reported).   Of note, portions of this note may have been created with voice recognition software. While this note has been edited for accuracy, occasional wrong-word or 'sound-a-like' substitutions may have occurred due to the inherent limitations of voice recognition software.   Victoriano Lain, MD [facility] 01/18/2023, 6:24 AM

## 2023-02-08 IMAGING — US US TRANSVAGINAL NON-OB
2 series · 13 of 25 positions shown · non-contrast
Comparison: None

CLINICAL DATA: Postmenopausal vaginal bleeding

EXAM:
TRANSABDOMINAL AND TRANSVAGINAL ULTRASOUND OF PELVIS
TECHNIQUE: Study was performed transabdominally to optimize pelvic field of
view evaluation and transvaginally to optimize internal visceral
architecture evaluation.

[Series 1: us transvaginal non-ob · 12 of 111 slices shown (1 of 2)]
[im 1/111]
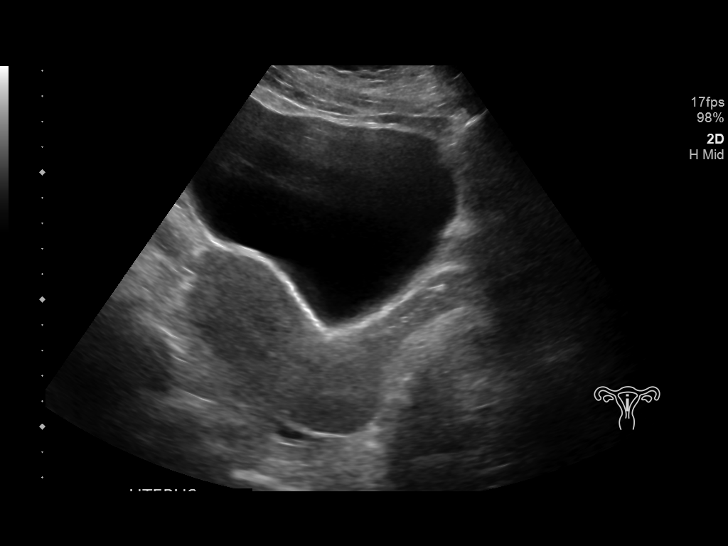
[im 10/111]
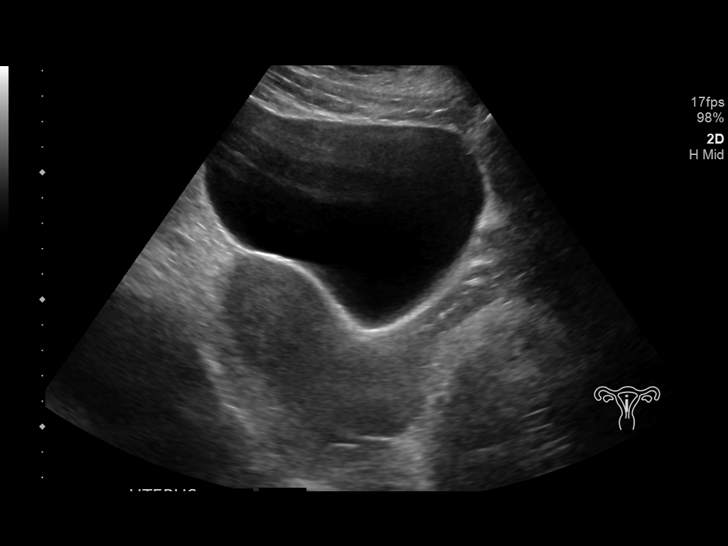
[im 20/111]
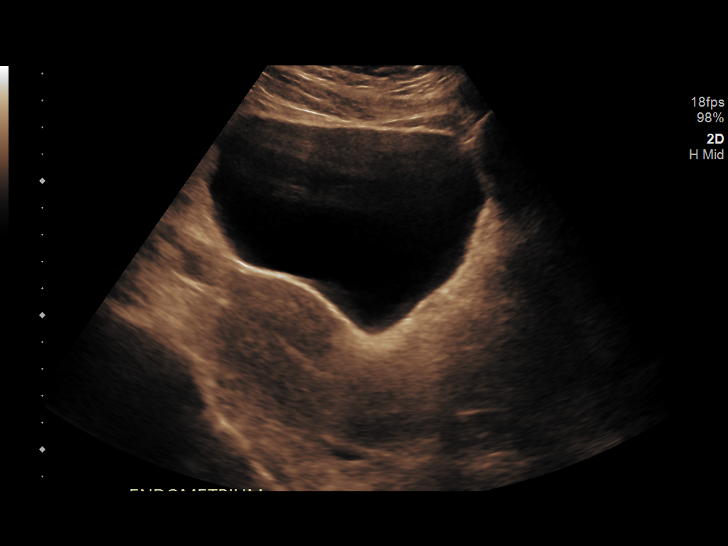
[im 29/111]
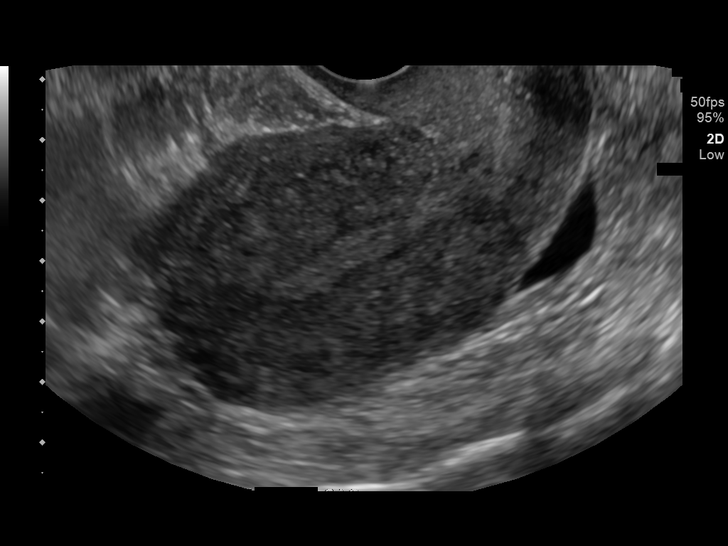
[im 39/111]
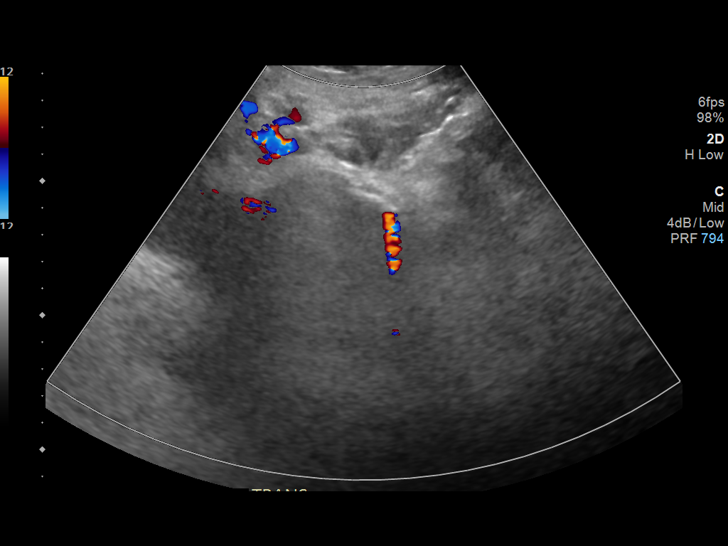
[im 48/111]
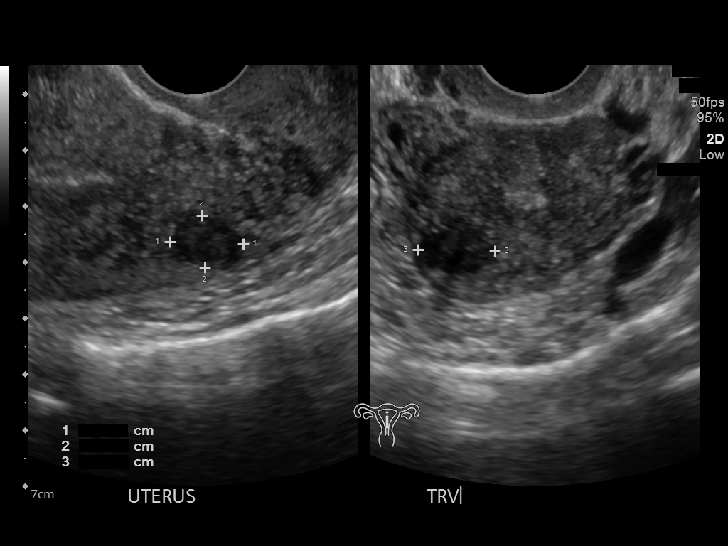
[im 58/111]
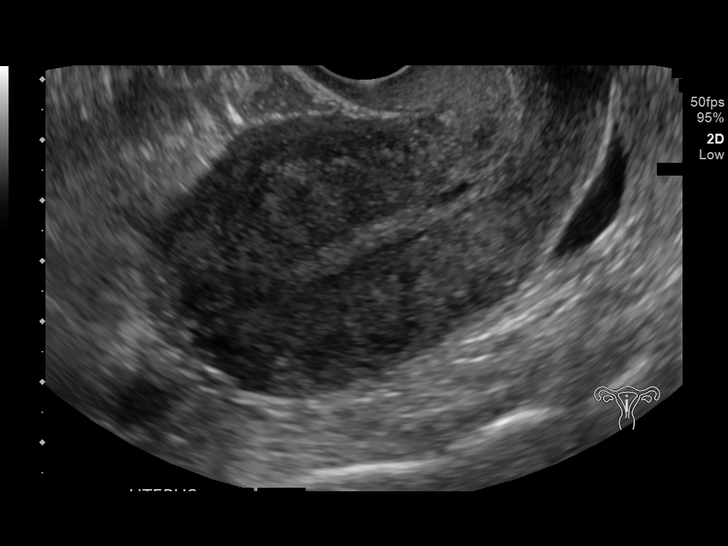
[im 67/111]
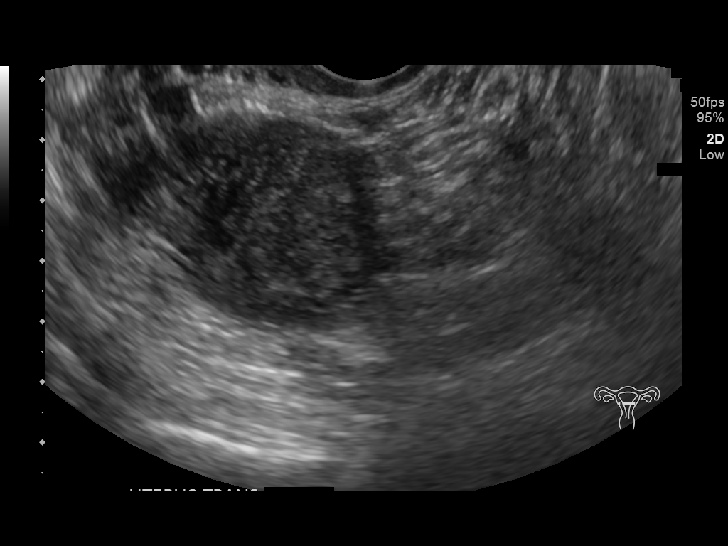
[im 77/111]
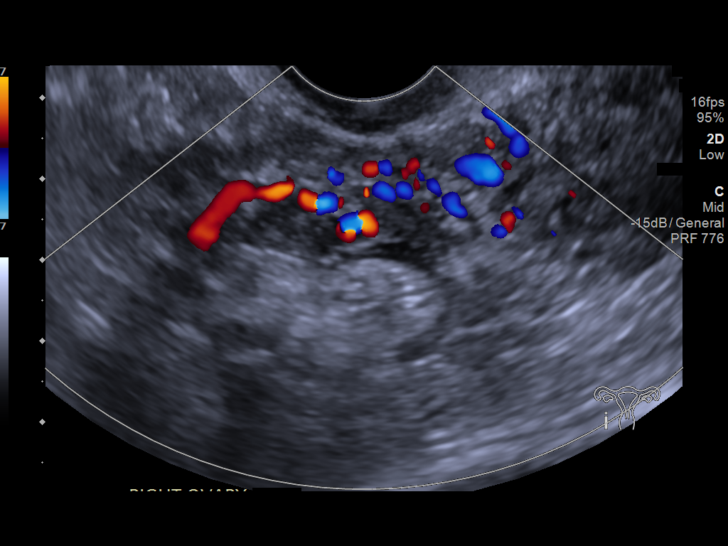
[im 87/111]
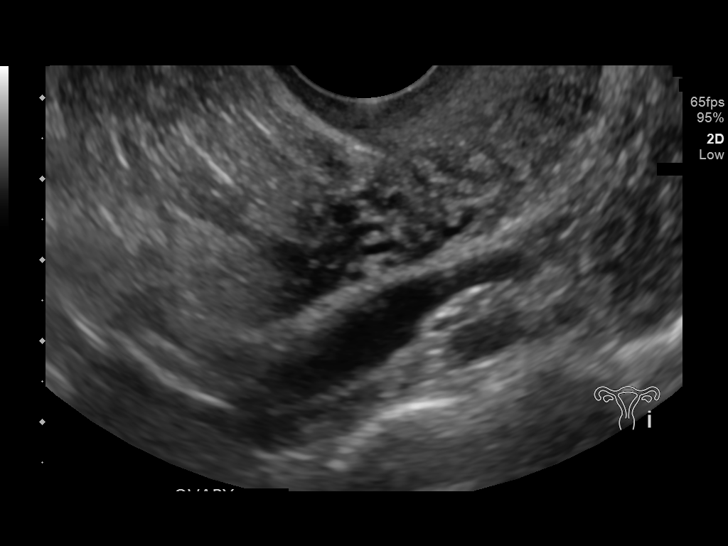
[im 96/111]
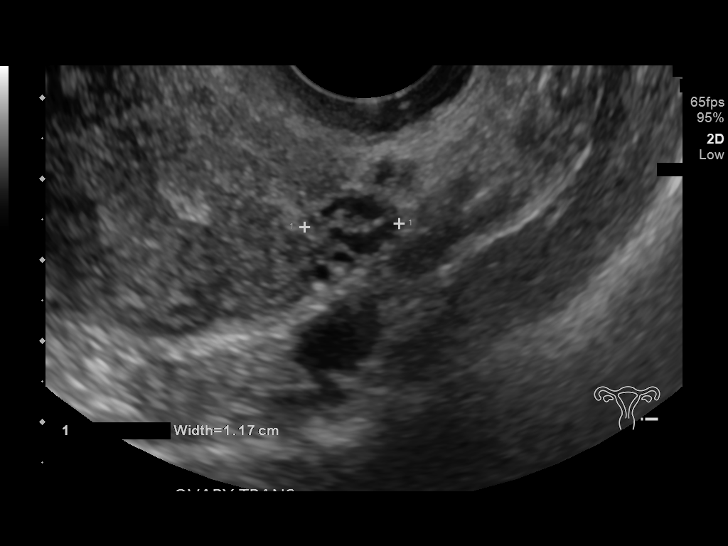
[im 106/111]
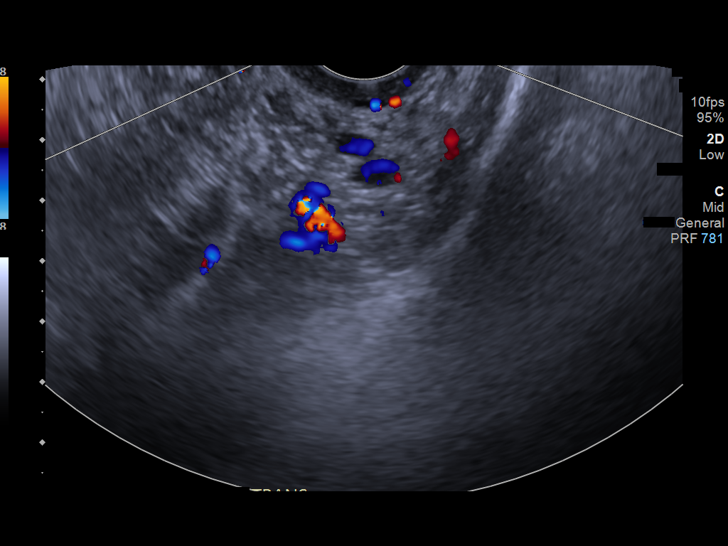

[Series 3: us transvaginal non-ob · 1 of 1 slices shown (2 of 2)]
[im 1/1]
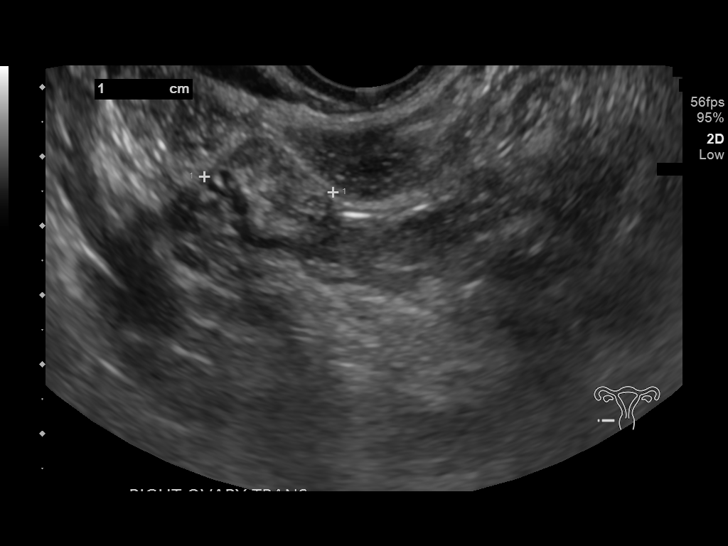

[13 of 25 positions shown; findings below may reference images not displayed]

FINDINGS: Uterus

Measurements: 8.4 x 4.4 x 5.2 cm = volume: 102.0 mL. There is a
hypoechoic mass in the inferior rightward aspect of the myometrium
measuring 1.3 x 0.9 x 1.4 cm.

Endometrium

Thickness: 6 mm. Endometrial contour is smooth. There is mild fluid
in the lower endometrial segment.

Right ovary

Measurements: 3.4 x 1.2 x 1.9 cm = volume: 4.1 mL. Normal
appearance/no adnexal mass.

Left ovary

Measurements: 1.7 x 0.9 x 1.2 cm = volume: 1.0 mL. Normal
appearance/no adnexal mass.

Other findings

No abnormal free fluid.
IMPRESSION: 1. Apparent leiomyoma along the rightward lower uterine segment
measuring 1.3 x 0.9 x 1.4 cm. Myometrium otherwise appears
unremarkable.

2. Endometrium is mildly thickened given bleeding and postmenopausal
status. Gynecologic consultation advised. This finding may warrant
tissue sampling from the endometrium. Suspect mild hemorrhage in the
lower endometrial segment. No mass or contour irregularity noted in
the endometrium.

3.  No extrauterine pelvic mass or fluid.

## 2023-03-14 ENCOUNTER — Other Ambulatory Visit: Payer: Self-pay | Admitting: Family Medicine

## 2023-03-14 DIAGNOSIS — Z1231 Encounter for screening mammogram for malignant neoplasm of breast: Secondary | ICD-10-CM

## 2023-04-02 ENCOUNTER — Ambulatory Visit
Admission: RE | Admit: 2023-04-02 | Discharge: 2023-04-02 | Disposition: A | Discharge status: Home / Self Care | Payer: Self-pay | Source: Ambulatory Visit | Attending: Family Medicine | Admitting: Family Medicine

## 2023-04-02 DIAGNOSIS — Z1231 Encounter for screening mammogram for malignant neoplasm of breast: Secondary | ICD-10-CM

## 2023-05-28 ENCOUNTER — Emergency Department (HOSPITAL_COMMUNITY)
Admission: EM | Admit: 2023-05-28 | Discharge: 2023-05-28 | Disposition: A | Discharge status: Home / Self Care | Attending: Emergency Medicine | Admitting: Emergency Medicine

## 2023-05-28 ENCOUNTER — Emergency Department (HOSPITAL_COMMUNITY)

## 2023-05-28 ENCOUNTER — Other Ambulatory Visit: Payer: Self-pay

## 2023-05-28 DIAGNOSIS — K5900 Constipation, unspecified: Secondary | ICD-10-CM | POA: Diagnosis not present

## 2023-05-28 DIAGNOSIS — R519 Headache, unspecified: Secondary | ICD-10-CM | POA: Insufficient documentation

## 2023-05-28 DIAGNOSIS — R079 Chest pain, unspecified: Secondary | ICD-10-CM

## 2023-05-28 DIAGNOSIS — R0789 Other chest pain: Secondary | ICD-10-CM | POA: Insufficient documentation

## 2023-05-28 LAB — CBC WITH DIFFERENTIAL/PLATELET
Abs Immature Granulocytes: 0 10*3/uL (ref 0.00–0.07)
Basophils Absolute: 0 10*3/uL (ref 0.0–0.1)
Basophils Relative: 0 %
Eosinophils Absolute: 0.1 10*3/uL (ref 0.0–0.5)
Eosinophils Relative: 2 %
HCT: 42.1 % (ref 36.0–46.0)
Hemoglobin: 13.2 g/dL (ref 12.0–15.0)
Immature Granulocytes: 0 %
Lymphocytes Relative: 38 %
Lymphs Abs: 2 10*3/uL (ref 0.7–4.0)
MCH: 23.2 pg — ABNORMAL LOW (ref 26.0–34.0)
MCHC: 31.4 g/dL (ref 30.0–36.0)
MCV: 74 fL — ABNORMAL LOW (ref 80.0–100.0)
Monocytes Absolute: 0.4 10*3/uL (ref 0.1–1.0)
Monocytes Relative: 7 %
Neutro Abs: 2.7 10*3/uL (ref 1.7–7.7)
Neutrophils Relative %: 53 %
Platelets: 249 10*3/uL (ref 150–400)
RBC: 5.69 MIL/uL — ABNORMAL HIGH (ref 3.87–5.11)
RDW: 21.4 % — ABNORMAL HIGH (ref 11.5–15.5)
WBC: 5.2 10*3/uL (ref 4.0–10.5)
nRBC: 0 % (ref 0.0–0.2)

## 2023-05-28 LAB — TROPONIN I (HIGH SENSITIVITY)
Troponin I (High Sensitivity): 4 ng/L (ref <18)
Troponin I (High Sensitivity): 3 ng/L (ref <18)

## 2023-05-28 LAB — BASIC METABOLIC PANEL WITH GFR
Anion gap: 10 (ref 5–15)
BUN: 11 mg/dL (ref 6–20)
CO2: 22 mmol/L (ref 22–32)
Calcium: 9.3 mg/dL (ref 8.9–10.3)
Chloride: 105 mmol/L (ref 98–111)
Creatinine, Ser: 1.04 mg/dL — ABNORMAL HIGH (ref 0.44–1.00)
GFR, Estimated: >60 mL/min (ref >60)
Glucose, Bld: 86 mg/dL (ref 70–99)
Potassium: 4.4 mmol/L (ref 3.5–5.1)
Sodium: 137 mmol/L (ref 135–145)

## 2023-05-28 MED ORDER — FAMOTIDINE 20 MG PO TABS
20.0000 mg | ORAL_TABLET | Freq: Once | ORAL | Status: AC
  Administered 2023-05-28: 20 mg via ORAL
  Filled 2023-05-28: qty 1

## 2023-05-28 MED ORDER — ALUM & MAG HYDROXIDE-SIMETH 200-200-20 MG/5ML PO SUSP
30.0000 mL | Freq: Once | ORAL | Status: AC
  Administered 2023-05-28: 30 mL via ORAL
  Filled 2023-05-28: qty 30

## 2023-05-28 NOTE — ED Provider Notes (Signed)
 [place] [facility] Provider Note   CSN: [id number] Arrival date & time: 05/28/23  1000     History  Chief Complaint  Patient presents with   Chest Pain    Danielle Bauer is a 53 y.o. female presenting to the emergency department with complaint of chest discomfort.  Patient reports that symptoms began 3 days ago on Friday when she laid down on the couch to take a nap.  She woke up with substernal pain and left side of her chest that she felt radiated into her neck and somewhat down her left arm.  She has the pain gradually improved over the course of the weekend, but was still present today.  She also had a mild headache with the symptoms.  She reports she has had a lot of belching, and when she burps she feels relief of some of her chest pressure.  She reports she has had some mild constipation over the weekend but she is passing gas, last bowel movement 2 days ago.  She went to urgent care today where she was referred into the ED for further evaluation.  EMS gave the patient 325 mg of aspirin.  Patient denies any known personal or family significant cardiac history of MI at a young age.  She denies any known coronary history.  She denies known history of hypertension, high cholesterol, diabetes, smoking.  She denies exertional chest pain or anginal symptoms.  She denies any unilateral leg or calf pain or swelling or any personal or family history of DVT or PE.  HPI     Home Medications Prior to Admission medications   Medication Sig Start Date End Date Taking? Authorizing Provider  baclofen (LIORESAL) 10 MG tablet Take 10 mg by mouth 2 (two) times daily. 05/26/23  Yes [provider]  Multiple Vitamin (MULTIVITAMIN) capsule Take 1 capsule by mouth daily.   Yes [provider]      Allergies    Patient has no known allergies.    Review of Systems   Review of Systems  Physical Exam Updated Vital Signs BP 120/82   Pulse 74    Temp 98.3 F (36.8 C) (Oral)   Resp 20   Ht 5\' 7"  (1.702 m)   Wt 110.7 kg   SpO2 100%   BMI 38.22 kg/m  Physical Exam Constitutional:      General: She is not in acute distress. HENT:     Head: Normocephalic and atraumatic.  Eyes:     Conjunctiva/sclera: Conjunctivae normal.     Pupils: Pupils are equal, round, and reactive to light.  Cardiovascular:     Rate and Rhythm: Normal rate and regular rhythm.  Pulmonary:     Effort: Pulmonary effort is normal. No respiratory distress.  Abdominal:     General: There is no distension.     Tenderness: There is no abdominal tenderness.  Skin:    General: Skin is warm and dry.  Neurological:     General: No focal deficit present.     Mental Status: She is alert. Mental status is at baseline.  Psychiatric:        Mood and Affect: Mood normal.        Behavior: Behavior normal.     ED Results / Procedures / Treatments   Labs (all labs ordered are listed, but only abnormal results are displayed) Labs Reviewed  BASIC METABOLIC PANEL WITH GFR - Abnormal; Notable for the following components:  Result Value   Creatinine, Ser 1.04 (*)    All other components within normal limits  CBC WITH DIFFERENTIAL/PLATELET - Abnormal; Notable for the following components:   RBC 5.69 (*)    MCV 74.0 (*)    MCH 23.2 (*)    RDW 21.4 (*)    All other components within normal limits  TROPONIN I (HIGH SENSITIVITY)  TROPONIN I (HIGH SENSITIVITY)    EKG EKG Interpretation Date/Time:  Monday May 28 2023 10:08:24 EDT Ventricular Rate:  61 PR Interval:  209 QRS Duration:  88 QT Interval:  387 QTC Calculation: 390 R Axis:   62  Text Interpretation: Sinus rhythm  Borderline T abnormalities, lateral leads No significant change since 2008 tracing  Confirmed by Jerald Molly [phone]) on 05/28/2023 10:10:08 AM  Radiology DG Chest 2 View Result Date: 05/28/2023 CLINICAL DATA:  Left-sided chest pain. EXAM: CHEST - 2 VIEW COMPARISON:  Chest  radiograph dated 02/24/2006. FINDINGS: The heart size and mediastinal contours are within normal limits. Both lungs are clear. The visualized skeletal structures are unremarkable. IMPRESSION: No active cardiopulmonary disease. Electronically Signed   By: Angus Bark M.D.   On: 05/28/2023 11:54    Procedures Procedures    Medications Ordered in ED Medications  alum & mag hydroxide-simeth (MAALOX/MYLANTA) 200-200-20 MG/5ML suspension 30 mL (30 mLs Oral Given 05/28/23 1100)  famotidine (PEPCID) tablet 20 mg (20 mg Oral Given 05/28/23 1100)    ED Course/ Medical Decision Making/ A&P                                 Medical Decision Making Amount and/or Complexity of Data Reviewed Labs: ordered. Radiology: ordered.  Risk OTC drugs.   This patient presents to the Emergency Department with complaint of chest pain. This involves an extensive number of treatment options, and is a complaint that carries with it a high risk of complications and morbidity. .The differential diagnosis includes ACS vs Pneumothorax vs Reflux/Gastritis vs MSK pain vs Pneumonia vs other.  I felt PE was less likely given that patient has no acute risk factors for PE, low Well's score - no hypoxia or tachycardia either to raise concern  I ordered, reviewed, and interpreted labs.  Pertinent results include no emergent findings.  Delta troponin negative I ordered medication GI cocktail for chest pain I ordered imaging studies which included dg chest  I independently visualized and interpreted imaging which showed no emergent finding and the monitor tracing which showed some rhythm. I agree with the radiologist interpretation I personally reviewed the patients ECG which showed sinus rhythm with no acute ischemic findings -patient has nonspecific T wave inversions that I reviewed from her prior tracing in 2008 and appear unchanged to me.  After the interventions stated above, I reevaluated the patient and found that  they were stable  Based on the patient's clinical exam, vital signs, risk factors, and ED testing, I felt that the patient's overall risk of life-threatening emergency such as ACS, PE, sepsis, or infection was low.  At this time, I felt the patient's presentation was most clinically consistent with gastritis or reflux, but explained to the patient that this evaluation was not a definitive diagnostic workup.  HEART score of 2 - overall low risk for MACE, but reasonable to refer to cardiology for outpatient cardiac workup given that she has not had cardiac testing before.  Return precautions provided to patient and husband at bedside.  Stable for discharge.        Final Clinical Impression(s) / ED Diagnoses Final diagnoses:  Chest pain, unspecified type    Rx / DC Orders ED Discharge Orders          Ordered    Ambulatory referral to Cardiology       Comments: Chest pain   05/28/23 1424              Arvilla Birmingham, MD 05/28/23 1551

## 2023-05-28 NOTE — Discharge Instructions (Addendum)
 Your blood tests and workup in the ER was reassuring did not show signs of an active heart attack or other medical emergency.  It is possible that your symptoms are related to reflux or gastritis.    However, as I explained, a single normal workup in the ER does not rule out all potential serious heart disease.  I did place a referral to the cardiology clinic for further evaluation.  If you do not hear from their scheduling office within 3 business days, please call the number above for heart care and request the next available appointment.

## 2023-05-28 NOTE — ED Notes (Signed)
 Patient transported to X-ray

## 2023-05-28 NOTE — ED Notes (Signed)
 Patient discharged by RN. Patient verbalizes understanding of instructions with no additional questions. Ambulatory to lobby at time of discharge.

## 2023-05-28 NOTE — ED Triage Notes (Signed)
 Pt BIBEMS, brought in from [facility] on [address].   Chest pain started Friday that radiated down left arm and up neck. Pt thought is was a muscle spasm, no improvement with muscle relaxer. EMS notes most symptoms are gone now. 324 ASA given in route.  BP 136/90 100% O2 70s HR 103 CBG

## 2023-05-28 NOTE — ED Notes (Signed)
 Patient ambulated to bathroom with steady gait.

## 2023-09-22 LAB — AMB RESULTS CONSOLE CBG: Glucose: 104
